# Patient Record
Sex: Female | Born: 1965 | Race: White | Hispanic: No | Smoking: Never smoker
Health system: Southern US, Community
[De-identification: ages and names within clinical notes are randomized; demographics above are authoritative.]

## PROBLEM LIST (undated history)

## (undated) DIAGNOSIS — K219 Gastro-esophageal reflux disease without esophagitis: Secondary | ICD-10-CM

## (undated) DIAGNOSIS — C539 Malignant neoplasm of cervix uteri, unspecified: Secondary | ICD-10-CM

## (undated) DIAGNOSIS — I10 Essential (primary) hypertension: Secondary | ICD-10-CM

## (undated) DIAGNOSIS — M1990 Unspecified osteoarthritis, unspecified site: Secondary | ICD-10-CM

## (undated) HISTORY — DX: Malignant neoplasm of cervix uteri, unspecified: C53.9

## (undated) HISTORY — DX: Gastro-esophageal reflux disease without esophagitis: K21.9

## (undated) HISTORY — DX: Essential (primary) hypertension: I10

## (undated) HISTORY — DX: Unspecified osteoarthritis, unspecified site: M19.90

---

## 2001-10-03 ENCOUNTER — Encounter: Payer: Self-pay | Admitting: Emergency Medicine

## 2001-10-03 ENCOUNTER — Emergency Department (HOSPITAL_COMMUNITY): Admission: EM | Admit: 2001-10-03 | Discharge: 2001-10-03 | Payer: Self-pay | Admitting: Emergency Medicine

## 2017-06-14 DIAGNOSIS — Z139 Encounter for screening, unspecified: Secondary | ICD-10-CM

## 2017-06-14 LAB — POCT URINALYSIS DIPSTICK
Bilirubin, UA: NEGATIVE
Glucose, UA: NEGATIVE
Ketones, UA: NEGATIVE
Leukocytes, UA: NEGATIVE
Nitrite, UA: NEGATIVE
Protein, UA: NEGATIVE
Spec Grav, UA: >=1.03 — AB (ref 1.010–1.025)
Urobilinogen, UA: 0.2 E.U./dL
pH, UA: 5.5 (ref 5.0–8.0)

## 2017-06-14 LAB — GLUCOSE, POCT (MANUAL RESULT ENTRY): POC Glucose: 75 mg/dl (ref 70–99)

## 2017-06-15 ENCOUNTER — Encounter: Payer: Self-pay | Admitting: *Deleted

## 2017-06-15 ENCOUNTER — Encounter: Payer: Self-pay | Admitting: Physician Assistant

## 2017-06-15 ENCOUNTER — Telehealth: Payer: Self-pay

## 2017-06-15 ENCOUNTER — Other Ambulatory Visit (HOSPITAL_COMMUNITY)
Admission: RE | Admit: 2017-06-15 | Discharge: 2017-06-15 | Disposition: A | Discharge status: Home / Self Care | Payer: Self-pay | Source: Ambulatory Visit | Attending: Physician Assistant | Admitting: Physician Assistant

## 2017-06-15 ENCOUNTER — Ambulatory Visit: Payer: Self-pay | Admitting: Physician Assistant

## 2017-06-15 VITALS — BP 140/80 | HR 83 | Temp 97.9°F | Ht 64.25 in | Wt 218.0 lb

## 2017-06-15 DIAGNOSIS — Z131 Encounter for screening for diabetes mellitus: Secondary | ICD-10-CM

## 2017-06-15 DIAGNOSIS — R030 Elevated blood-pressure reading, without diagnosis of hypertension: Secondary | ICD-10-CM

## 2017-06-15 DIAGNOSIS — Z13220 Encounter for screening for lipoid disorders: Secondary | ICD-10-CM

## 2017-06-15 DIAGNOSIS — N852 Hypertrophy of uterus: Secondary | ICD-10-CM

## 2017-06-15 DIAGNOSIS — L989 Disorder of the skin and subcutaneous tissue, unspecified: Secondary | ICD-10-CM

## 2017-06-15 DIAGNOSIS — Z1239 Encounter for other screening for malignant neoplasm of breast: Secondary | ICD-10-CM

## 2017-06-15 DIAGNOSIS — R1084 Generalized abdominal pain: Secondary | ICD-10-CM

## 2017-06-15 DIAGNOSIS — R319 Hematuria, unspecified: Secondary | ICD-10-CM

## 2017-06-15 LAB — LIPID PANEL
CHOL/HDL RATIO: 4.1 ratio
CHOLESTEROL: 219 mg/dL — AB (ref 0–200)
HDL: 53 mg/dL (ref >40)
LDL Cholesterol: 135 mg/dL — ABNORMAL HIGH (ref 0–99)
Triglycerides: 153 mg/dL — ABNORMAL HIGH (ref <150)
VLDL: 31 mg/dL (ref 0–40)

## 2017-06-15 LAB — HEMOGLOBIN A1C
HEMOGLOBIN A1C: 5.6 % (ref 4.8–5.6)
Mean Plasma Glucose: 114.02 mg/dL

## 2017-06-15 LAB — CBC WITH DIFFERENTIAL/PLATELET
BASOS PCT: 1 %
Basophils Absolute: 0 10*3/uL (ref 0.0–0.1)
EOS PCT: 4 %
Eosinophils Absolute: 0.2 10*3/uL (ref 0.0–0.7)
HEMATOCRIT: 43.9 % (ref 36.0–46.0)
Hemoglobin: 14 g/dL (ref 12.0–15.0)
Lymphocytes Relative: 36 %
Lymphs Abs: 2.3 10*3/uL (ref 0.7–4.0)
MCH: 28.1 pg (ref 26.0–34.0)
MCHC: 31.9 g/dL (ref 30.0–36.0)
MCV: 88.2 fL (ref 78.0–100.0)
MONO ABS: 0.6 10*3/uL (ref 0.1–1.0)
MONOS PCT: 9 %
NEUTROS ABS: 3.2 10*3/uL (ref 1.7–7.7)
Neutrophils Relative %: 50 %
PLATELETS: 258 10*3/uL (ref 150–400)
RBC: 4.98 MIL/uL (ref 3.87–5.11)
RDW: 13 % (ref 11.5–15.5)
WBC: 6.3 10*3/uL (ref 4.0–10.5)

## 2017-06-15 LAB — COMPREHENSIVE METABOLIC PANEL
ALBUMIN: 4 g/dL (ref 3.5–5.0)
ALT: 31 U/L (ref 14–54)
AST: 23 U/L (ref 15–41)
Alkaline Phosphatase: 75 U/L (ref 38–126)
Anion gap: 10 (ref 5–15)
BILIRUBIN TOTAL: 0.6 mg/dL (ref 0.3–1.2)
BUN: 18 mg/dL (ref 6–20)
CO2: 25 mmol/L (ref 22–32)
Calcium: 9.1 mg/dL (ref 8.9–10.3)
Chloride: 102 mmol/L (ref 101–111)
Creatinine, Ser: 0.86 mg/dL (ref 0.44–1.00)
GFR calc Af Amer: >60 mL/min (ref >60)
GLUCOSE: 102 mg/dL — AB (ref 65–99)
POTASSIUM: 4.1 mmol/L (ref 3.5–5.1)
Sodium: 137 mmol/L (ref 135–145)
TOTAL PROTEIN: 7.4 g/dL (ref 6.5–8.1)

## 2017-06-15 NOTE — Congregational Nurse Program (Signed)
Congregational Nurse Program Note  Date of Encounter: 06/14/2017  Past Medical History: No past medical history on file.  Encounter Details: CNP Questionnaire - 06/14/17 1500      Questionnaire   Patient Status  Not Applicable    Race  White or Caucasian    Location Patient Served At  [facility]  Not Applicable    Uninsured  Uninsured (NEW 1x/quarter)    Food  No food insecurities    Housing/Utilities  No permanent housing    Transportation  No transportation needs    Interpersonal Safety  Yes, feel physically and emotionally safe where you currently live    Medication  No medication insecurities    Referrals  [facility];Other;Primary Care Provider/Clinic;Behavioral/Mental Health Provider    ED Visit Averted  Not Applicable    Life-Saving Intervention Made  Not Applicable       New client to [facility]. Client is here today seeking assistance for a referral into a primary care provider. Her chief concern medically is that she concerned about having either a prolapsed uterus or bladder. She states she has felt perineum pressure about 3 months, but for the last month she has been able to visualize a "ball like" in her vaginal area. Client currently is unemployed she lost her job Last March and subsequently lost her car and then in June lost her home. She has no health insurance and has not seen a primary care provider in over 5 years per client. 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 Client states she receives food stamps and that she has an area where she can cook or heat up food. Client just recently started a disability application.  Past Medical History: Arthritis  Cervical cancer age 52    No Known drug allergies  Alert and oriented to person and place. Client is very emotional and cried through the entire screening. Client reports being  very scared and concerned about what is wrong with her and also has been under a tremendous amount of stress and loss. Denies suicidal or homicidal thoughts currently. She does admit to fleeting thoughts of "maybe I'd be better off not here" no plan to act on those thoughts. No thoughts of that today. Will refer to [facility] today for further evaluation and risk assessment. Client is agreeable that counseling would be helpful and is willing to do that. Client denies any chest pains or shortness of breath. She does report problems with acid reflux and has been prescribed something in the past for it, but doesn't remember, thinks it may have been Protonix.  Client does report random periods of nausea, but no vomiting. Denies any fevers. Afebrile today. She does report headaches frequently which she takes ibuprofen for. She reports lower abdominal pain in pelvic area, but is non tender with palpation. Bowel sounds present, abdomen soft. She denies constipation or trouble with bowels. She does report having to frequently go urinate about hourly. She denies burning or any visual blood in her urine. She does report that at times its darker but then is normal again. Will check a POCT urine dipstick today.  Client reports pressure in her rectal area and lower back pain. She however also reports that she has had some lower back and leg pain before the "prolapse". When she was working she does report lifting heavy amounts  over a 12 hour shift of up to 45 pounds. She has one child who is 10 and also recently moved away to [place] and is having client's first grandchild soon. Client continues to be very emotional during entire screening.  Discussed with client options for medical care without insurance or income. Those being, [facility] in [place] and discussed that process. The health department in [place] and [facility] with main office in [place], but does have half days once a  month in [place] and [place]. Client wishes to proceed with a referral into [facility] as she is very concerned and wants to be seen as soon as possible. Discussed with client that addressing this issue will be a process and she understands.  Referral made to the free clinic and appointment secured for 06/15/17 at 0945. Client given contact information as well as appointment date and time with discussion about calling 24 hrs ahead if she needs to reschedule or cancel and she states understanding.  Referral made to [facility] intern for further needs assessment and risk assessment.Mental health Safety plan formulated and copy given to client, client also given 24 Crisis number. Client connected to [facility] by intern. She is also scheduled to come back to see [facility] intern on 06/21/17 to discuss and work on housing application and follow up.   Client denies need for extra food help at present she receives food stamps and states that is adequate.   Will follow after initial appointment with [facility].  POCT glucose today 75 Random  POCT urine dipstick  Color amber Clarity clear Glucose: Negative BIL: Negative KET: Negative SG >=1.030 BLO: trace lysed PH: 5.5 PRO: Negative URO: 0.2 E.U/dl NIT: Negative LEU: Negative

## 2017-06-15 NOTE — Progress Notes (Signed)
BP 140/80 (BP Location: Left Arm, Patient Position: Sitting, Cuff Size: Normal)   Pulse 83   Temp 97.9 F (36.6 C)   Ht 5' 4.25" (1.632 m)   Wt 218 lb (98.9 kg)   SpO2 97%   BMI 37.13 kg/m    Subjective:    Patient ID: Elizabeth Moore, female    DOB: [birth year], 52 y.o.   MRN: [id number]  HPI: Elizabeth Moore is a 52 y.o. female presenting on 06/15/2017 for New Patient (Initial Visit) (pt has not been to a doctor. pt doesn't remmeber how long ago) and Knee Pain (bilateral intermittent pain pt states it is arthritis)   HPI   Chief Complaint  Patient presents with  . New Patient (Initial Visit)    pt has not been to a doctor. pt doesn't remmeber how long ago  . Knee Pain    bilateral intermittent pain pt states it is arthritis     Her chief concern is that she concerned about having either a prolapsed uterus or bladder. She states she has felt perineum pressure about 3 months, but for the last month she has been able to visualize a "ball like" in her vaginal area.  Last PAP about 19 years ago.    She has irritationof the genital area for about a month.  She used some neosporin but it didn't help.   Pt denies dysuria.  She says it itches after she urinates sometimes and her skin feels bad after she urinates.   UA yesterday trace blood otherwise negative.   She has not been sexually active for years.   She states her menses stopped at around age 62 or 35.  Pt is trying to get disability due to her knees.   Pt has never had a mammogram.   Relevant past medical, surgical, family and social history reviewed and updated as indicated. Interim medical history since our last visit reviewed. Allergies and medications reviewed and updated.   Current Outpatient Medications:  .  cetirizine (ZYRTEC) 10 MG tablet, Take 10 mg by mouth daily., Disp: , Rfl:  .  ibuprofen (ADVIL,MOTRIN) 200 MG tablet, Take 600 mg by mouth every 8 (eight) hours as needed., Disp: , Rfl:    Review  of Systems  Constitutional: Negative for appetite change, chills, diaphoresis, fatigue, fever and unexpected weight change.  HENT: Positive for ear pain. Negative for congestion, dental problem, drooling, facial swelling, hearing loss, mouth sores, sneezing, sore throat, trouble swallowing and voice change.   Eyes: Positive for itching. Negative for pain, discharge, redness and visual disturbance.  Respiratory: Negative for cough, choking, shortness of breath and wheezing.   Cardiovascular: Positive for leg swelling. Negative for chest pain and palpitations.  Gastrointestinal: Positive for abdominal pain. Negative for blood in stool, constipation, diarrhea and vomiting.  Endocrine: Positive for polydipsia. Negative for cold intolerance and heat intolerance.  Genitourinary: Positive for dysuria and hematuria. Negative for decreased urine volume.  Musculoskeletal: Positive for arthralgias, back pain and gait problem.  Skin: Negative for rash.  Allergic/Immunologic: Positive for environmental allergies.  Neurological: Negative for seizures, syncope, light-headedness and headaches.  Hematological: Negative for adenopathy.  Psychiatric/Behavioral: Negative for agitation, dysphoric mood and suicidal ideas. The patient is nervous/anxious.     Per HPI unless specifically indicated above     Objective:    BP 140/80 (BP Location: Left Arm, Patient Position: Sitting, Cuff Size: Normal)   Pulse 83   Temp 97.9 F (36.6 C)   Ht 5'  4.25" (1.632 m)   Wt 218 lb (98.9 kg)   SpO2 97%   BMI 37.13 kg/m   Wt Readings from Last 3 Encounters:  06/15/17 218 lb (98.9 kg)  06/14/17 219 lb 6.4 oz (99.5 kg)    Physical Exam  Constitutional: She is oriented to person, place, and time. She appears well-developed and well-nourished.  HENT:  Head: Normocephalic and atraumatic.  Neck: Neck supple.  Cardiovascular: Normal rate and regular rhythm.  Pulmonary/Chest: Effort normal and breath sounds normal.   Abdominal: Soft. Bowel sounds are normal. She exhibits no mass. There is no hepatosplenomegaly. There is no tenderness.  Genitourinary: There is lesion on the right labia. There is lesion on the left labia. Uterus is enlarged and tender.  Genitourinary Comments: Unable to visualize or palpate cervix.  There is no prolapse seen.  The vulva area with abnormal appearance- tissues thickened and white. (nurse Berenice assisted)  Musculoskeletal: She exhibits no edema.  Lymphadenopathy:    She has no cervical adenopathy.  Neurological: She is alert and oriented to person, place, and time.  Skin: Skin is warm and dry.  Psychiatric: She has a normal mood and affect. Her behavior is normal.  Vitals reviewed.   Results for orders placed or performed in visit on 06/14/17  POCT glucose  Result Value Ref Range   POC Glucose 75 70 - 99 mg/dl  POCT Urinalysis dipstick  Result Value Ref Range   Color, UA Amber    Clarity, UA Clear    Glucose, UA Negative    Bilirubin, UA Negative    Ketones, UA Negative    Spec Grav, UA >=1.030 (A) 1.010 - 1.025   Blood, UA Trace lysed    pH, UA 5.5 5.0 - 8.0   Protein, UA Negative    Urobilinogen, UA 0.2 0.2 or 1.0 E.U./dL   Nitrite, UA Negative    Leukocytes, UA Negative Negative   Appearance clear    Odor none       Assessment & Plan:   Encounter Diagnoses  Name Primary?  . Generalized abdominal pain Yes  . Abnormal skin of vulva   . Enlarged uterus   . Hematuria, unspecified type   . Elevated blood pressure reading   . Screening cholesterol level   . Screening for diabetes mellitus   . Screening for breast cancer      -will order CT abd and pelvis to evaluate the abd pain and pelvic symptoms -Will refer to gyn -will Get baseline labs -Pt was [facility] -screening mammogram ordered -no medication for BP today.  Will recheck at next office visit as today is likely elevated due to pt worried about her health -Pt to  follow up 3 weeks.  RTO sooner prn for worsening or new symptoms

## 2017-06-15 NOTE — Telephone Encounter (Signed)
Called to follow up with client after her initial appointment with the free clinic to establish care. Client states that all went well, she has had blood work drawn and is scheduled to have a CT Scan next week and they are working on getting her an appointment with OB/GYN. Client is to follow up with Sander Nephew BSW intern next Tuesday at [facility] to discuss housing application and other needs. Will follow up with her at that time too. Client agreeable.

## 2017-06-21 ENCOUNTER — Other Ambulatory Visit: Payer: Self-pay | Admitting: Student

## 2017-06-21 DIAGNOSIS — R1084 Generalized abdominal pain: Secondary | ICD-10-CM

## 2017-06-22 ENCOUNTER — Other Ambulatory Visit: Payer: Self-pay | Admitting: Physician Assistant

## 2017-06-22 ENCOUNTER — Ambulatory Visit (HOSPITAL_COMMUNITY)
Admission: RE | Admit: 2017-06-22 | Discharge: 2017-06-22 | Disposition: A | Discharge status: Home / Self Care | Payer: Self-pay | Source: Ambulatory Visit | Attending: Physician Assistant | Admitting: Physician Assistant

## 2017-06-22 DIAGNOSIS — R1084 Generalized abdominal pain: Secondary | ICD-10-CM | POA: Insufficient documentation

## 2017-06-22 DIAGNOSIS — N83202 Unspecified ovarian cyst, left side: Secondary | ICD-10-CM | POA: Insufficient documentation

## 2017-06-22 MED ORDER — IOPAMIDOL (ISOVUE-300) INJECTION 61%
100.0000 mL | Freq: Once | INTRAVENOUS | Status: AC | PRN
  Administered 2017-06-22: 100 mL via INTRAVENOUS

## 2017-06-22 MED ORDER — DICLOFENAC SODIUM 75 MG PO TBEC: 75.0000 mg | DELAYED_RELEASE_TABLET | Freq: Two times a day (BID) | ORAL | 0 refills | Status: DC | PRN

## 2017-06-23 ENCOUNTER — Ambulatory Visit (HOSPITAL_COMMUNITY): Payer: Self-pay

## 2017-06-27 ENCOUNTER — Other Ambulatory Visit: Payer: Self-pay

## 2017-06-27 ENCOUNTER — Ambulatory Visit (INDEPENDENT_AMBULATORY_CARE_PROVIDER_SITE_OTHER): Payer: Self-pay | Admitting: Obstetrics and Gynecology

## 2017-06-27 ENCOUNTER — Encounter: Payer: Self-pay | Admitting: Obstetrics and Gynecology

## 2017-06-27 VITALS — BP 130/86 | HR 94 | Ht 65.0 in | Wt 216.0 lb

## 2017-06-27 DIAGNOSIS — N904 Leukoplakia of vulva: Secondary | ICD-10-CM

## 2017-06-27 DIAGNOSIS — N9069 Other specified hypertrophy of vulva: Secondary | ICD-10-CM

## 2017-06-27 NOTE — Progress Notes (Signed)
Patient ID: [name], female   DOB: 1965/07/14, 52 y.o.   MRN: [id number]   [facility] Visit  @DATE @            Patient name: Elizabeth Moore MRN [id number]  Date of birth: 1965-05-23  CC & HPI:  Elizabeth Moore is a 52 y.o. female presenting today for possible bladder prolapse. The patient reports that she can feel and see something between her legs when she goes to the bathroom. She emptied her bladder prior to her visit. Associated symptoms include pelvic irritation, occasional stress incontinence, weight gain, and occasional constipation. She last had her menstrual cycle when she was 52 years old. She reports that she had a CT scan that showed a benign cyst on 06/22/2017. The patient denies fever, chills or any other symptoms or complaints at this time.   ROS:  ROS +pelvic irritation +occasional stress incontinence +weight gain +occasional constipation -fever -chills All systems are negative except as noted in the HPI and PMH.   Pertinent History Reviewed:   Reviewed: Significant for cervical cancer Medical         Past Medical History:  Diagnosis Date  . Arthritis   . Cervical cancer [facility]) age 7  . GERD (gastroesophageal reflux disease)                               Surgical Hx:   History reviewed. No pertinent surgical history. Medications: Reviewed & Updated - see associated section                       Current Outpatient Medications:  .  cetirizine (ZYRTEC) 10 MG tablet, Take 10 mg by mouth daily., Disp: , Rfl:  .  diclofenac (VOLTAREN) 75 MG EC tablet, Take 1 tablet (75 mg total) by mouth 2 (two) times daily as needed., Disp: 30 tablet, Rfl: 0 .  ibuprofen (ADVIL,MOTRIN) 200 MG tablet, Take 600 mg by mouth every 8 (eight) hours as needed., Disp: , Rfl:    Social History: Reviewed -  reports that  has never smoked. she has never used smokeless tobacco.  Objective Findings:  Vitals: Blood pressure 130/86, pulse 94, height 5\' 5"  (1.651 m),  weight 216 lb (98 kg).  PHYSICAL EXAMINATION General appearance - alert, well appearing, and in no distress, oriented to person, place, and time and overweight Mental status - alert, oriented to person, place, and time, normal mood, behavior, speech, dress, motor activity, and thought processes, affect appropriate to mood  PELVIC Vulva - vulvar dystrophy  Cervix - normal  No cystocele and no rectocele  Assessment & Plan:   A:  1. extensiveVulvar dystrophy 2. NO CYstocele or rectocele 3.   P:  1. Rx Triamcinolone 1% 2. Weight Loss 3.  4 wks, or PRN after return from [place]. 4. Importance of biopsy of any site not responding to topical steroids discussed.   By signing my name below, I, [name], attest that this documentation has been prepared under the direction and in the presence of Jonnie Kind, MD. Electronically Signed: [name], Medical Scribe. 06/27/17. 12:28 PM.  I personally performed the services described in this documentation, which was SCRIBED in my presence. The recorded information has been reviewed and considered accurate. It has been edited as necessary during review. Jonnie Kind, MD

## 2017-07-05 ENCOUNTER — Other Ambulatory Visit: Payer: Self-pay | Admitting: Obstetrics and Gynecology

## 2017-07-05 DIAGNOSIS — Z1231 Encounter for screening mammogram for malignant neoplasm of breast: Secondary | ICD-10-CM

## 2017-07-06 ENCOUNTER — Ambulatory Visit: Payer: Self-pay | Admitting: Physician Assistant

## 2017-07-06 ENCOUNTER — Encounter: Payer: Self-pay | Admitting: Physician Assistant

## 2017-07-06 VITALS — BP 138/86 | HR 73 | Temp 98.1°F | Ht 65.0 in | Wt 220.0 lb

## 2017-07-06 DIAGNOSIS — M179 Osteoarthritis of knee, unspecified: Secondary | ICD-10-CM | POA: Insufficient documentation

## 2017-07-06 DIAGNOSIS — M25562 Pain in left knee: Secondary | ICD-10-CM

## 2017-07-06 DIAGNOSIS — R030 Elevated blood-pressure reading, without diagnosis of hypertension: Secondary | ICD-10-CM

## 2017-07-06 DIAGNOSIS — E785 Hyperlipidemia, unspecified: Secondary | ICD-10-CM

## 2017-07-06 DIAGNOSIS — R1031 Right lower quadrant pain: Secondary | ICD-10-CM

## 2017-07-06 DIAGNOSIS — G8929 Other chronic pain: Secondary | ICD-10-CM

## 2017-07-06 DIAGNOSIS — E669 Obesity, unspecified: Secondary | ICD-10-CM

## 2017-07-06 DIAGNOSIS — M25561 Pain in right knee: Secondary | ICD-10-CM

## 2017-07-06 DIAGNOSIS — N83202 Unspecified ovarian cyst, left side: Secondary | ICD-10-CM | POA: Insufficient documentation

## 2017-07-06 DIAGNOSIS — M1710 Unilateral primary osteoarthritis, unspecified knee: Secondary | ICD-10-CM | POA: Insufficient documentation

## 2017-07-06 NOTE — Progress Notes (Signed)
BP 138/86 (BP Location: Left Arm, Patient Position: Sitting, Cuff Size: Normal)   Pulse 73   Temp 98.1 F (36.7 C)   Ht 5\' 5"  (1.651 m)   Wt 220 lb (99.8 kg)   SpO2 97%   BMI 36.61 kg/m    Subjective:    Patient ID: Elizabeth Moore, female    DOB: Apr 27, 1966, 52 y.o.   MRN: [id number]  HPI: Elizabeth Moore is a 52 y.o. female presenting on 07/06/2017 for Follow-up   HPI   Pt with B knee pain.  she Says she used to get shots in them in [place].  She says she Was told she needed surgery but she couldn't afford it.  Pt is trying to get disability due to her knee pain- perhaps surgery or other treatment might be a better option  Pt complains of  RLQ abdominal pain that started suddenly yesterday when she bent over to pick something off the floor.  She says the pain is worse with movement.  She denies fever, nausea, vomiting.    Pt has mammogram appt 07/22/17  Pt is still complaining of prolapse uterus.  She has taken pictures with her phone of her genital area.  Pt was seen by gyn on 2/11 and was found not to have prolapse.  She has follow up scheduled for March 11.   Relevant past medical, surgical, family and social history reviewed and updated as indicated. Interim medical history since our last visit reviewed. Allergies and medications reviewed and updated.   Current Outpatient Medications:  .  cetirizine (ZYRTEC) 10 MG tablet, Take 10 mg by mouth daily., Disp: , Rfl:  .  diclofenac (VOLTAREN) 75 MG EC tablet, Take 1 tablet (75 mg total) by mouth 2 (two) times daily as needed., Disp: 30 tablet, Rfl: 0 .  ibuprofen (ADVIL,MOTRIN) 200 MG tablet, Take 600 mg by mouth every 8 (eight) hours as needed., Disp: , Rfl:    Review of Systems  Constitutional: Negative for appetite change, chills, diaphoresis, fatigue, fever and unexpected weight change.  HENT: Negative for congestion, drooling, ear pain, facial swelling, hearing loss, mouth sores, sneezing, sore throat, trouble  swallowing and voice change.   Eyes: Negative for pain, discharge, redness, itching and visual disturbance.  Respiratory: Negative for cough, choking, shortness of breath and wheezing.   Cardiovascular: Negative for chest pain, palpitations and leg swelling.  Gastrointestinal: Positive for abdominal pain. Negative for blood in stool, constipation, diarrhea and vomiting.  Endocrine: Negative for cold intolerance, heat intolerance and polydipsia.  Genitourinary: Negative for decreased urine volume, dysuria and hematuria.  Musculoskeletal: Positive for arthralgias and back pain. Negative for gait problem.  Skin: Negative for rash.  Allergic/Immunologic: Negative for environmental allergies.  Neurological: Negative for seizures, syncope, light-headedness and headaches.  Hematological: Negative for adenopathy.  Psychiatric/Behavioral: Negative for agitation, dysphoric mood and suicidal ideas. The patient is not nervous/anxious.     Per HPI unless specifically indicated above     Objective:    BP 138/86 (BP Location: Left Arm, Patient Position: Sitting, Cuff Size: Normal)   Pulse 73   Temp 98.1 F (36.7 C)   Ht 5\' 5"  (1.651 m)   Wt 220 lb (99.8 kg)   SpO2 97%   BMI 36.61 kg/m   Wt Readings from Last 3 Encounters:  07/06/17 220 lb (99.8 kg)  06/27/17 216 lb (98 kg)  06/15/17 218 lb (98.9 kg)    Physical Exam  Constitutional: She is oriented to person, place,  and time. She appears well-developed and well-nourished.  HENT:  Head: Normocephalic and atraumatic.  Neck: Neck supple.  Cardiovascular: Normal rate and regular rhythm.  Pulmonary/Chest: Effort normal and breath sounds normal.  Abdominal: Soft. Bowel sounds are normal. She exhibits no distension, no ascites, no pulsatile midline mass and no mass. There is no hepatosplenomegaly. There is tenderness in the right lower quadrant. There is no rigidity, no rebound, no guarding and no CVA tenderness.  Musculoskeletal: She exhibits no  edema.       Right knee: She exhibits normal range of motion, no swelling, no effusion, normal alignment, no LCL laxity and no MCL laxity.       Left knee: She exhibits normal range of motion, no swelling, no effusion, normal alignment, no LCL laxity and no MCL laxity.  Crepitus B knees  Lymphadenopathy:    She has no cervical adenopathy.  Neurological: She is alert and oriented to person, place, and time.  Skin: Skin is warm and dry.  Psychiatric: She has a normal mood and affect. Her behavior is normal.  Vitals reviewed.   Results for orders placed or performed during the hospital encounter of 06/15/17  Hemoglobin A1c  Result Value Ref Range   Hgb A1c MFr Bld 5.6 4.8 - 5.6 %   Mean Plasma Glucose 114.02 mg/dL  CBC w/Diff/Platelet  Result Value Ref Range   WBC 6.3 4.0 - 10.5 K/uL   RBC 4.98 3.87 - 5.11 MIL/uL   Hemoglobin 14.0 12.0 - 15.0 g/dL   HCT 43.9 36.0 - 46.0 %   MCV 88.2 78.0 - 100.0 fL   MCH 28.1 26.0 - 34.0 pg   MCHC 31.9 30.0 - 36.0 g/dL   RDW 13.0 11.5 - 15.5 %   Platelets 258 150 - 400 K/uL   Neutrophils Relative % 50 %   Neutro Abs 3.2 1.7 - 7.7 K/uL   Lymphocytes Relative 36 %   Lymphs Abs 2.3 0.7 - 4.0 K/uL   Monocytes Relative 9 %   Monocytes Absolute 0.6 0.1 - 1.0 K/uL   Eosinophils Relative 4 %   Eosinophils Absolute 0.2 0.0 - 0.7 K/uL   Basophils Relative 1 %   Basophils Absolute 0.0 0.0 - 0.1 K/uL  Comprehensive metabolic panel  Result Value Ref Range   Sodium 137 135 - 145 mmol/L   Potassium 4.1 3.5 - 5.1 mmol/L   Chloride 102 101 - 111 mmol/L   CO2 25 22 - 32 mmol/L   Glucose, Bld 102 (H) 65 - 99 mg/dL   BUN 18 6 - 20 mg/dL   Creatinine, Ser 0.86 0.44 - 1.00 mg/dL   Calcium 9.1 8.9 - 10.3 mg/dL   Total Protein 7.4 6.5 - 8.1 g/dL   Albumin 4.0 3.5 - 5.0 g/dL   AST 23 15 - 41 U/L   ALT 31 14 - 54 U/L   Alkaline Phosphatase 75 38 - 126 U/L   Total Bilirubin 0.6 0.3 - 1.2 mg/dL   GFR calc non Af Amer >60 >60 mL/min   GFR calc Af Amer >60 >60  mL/min   Anion gap 10 5 - 15  Lipid panel  Result Value Ref Range   Cholesterol 219 (H) 0 - 200 mg/dL   Triglycerides 153 (H) <150 mg/dL   HDL 53 >40 mg/dL   Total CHOL/HDL Ratio 4.1 RATIO   VLDL 31 0 - 40 mg/dL   LDL Cholesterol 135 (H) 0 - 99 mg/dL      Assessment &  Plan:    Encounter Diagnoses  Name Primary?  . RLQ abdominal pain Yes  . Chronic pain of both knees   . Elevated blood pressure reading   . Hyperlipidemia, unspecified hyperlipidemia type   . Left ovarian cyst   . Obesity, unspecified classification, unspecified obesity type, unspecified whether serious comorbidity present      -reviewed labs and CT with pt -counseled pt to avoid taking IBU and diclofenac simulatneously   HCM: -mammogram as scheduled.   vulvar dystrophy and genital complaints: -follow up with gyn as scheduled  Hyperlipidemia -discussed hyperlipidemia and pt will try to lower with lowfat diet and exercise.    Elevated blood pressure: - improved diet and exercise should also help her BP.  Will monitor and recheck at follow appointment  B knee pain: -Get xrays both knees. Will refer to orthopedist when she gets her charity care approved.  RLQ abd pain: -discussed with pt likely pulled muscle.  Will get [place] to evaluate the appendix.  Discussed with pt that since she just had abd/pelvis CT 2 weeks ago and probability that pain is due to muscle strain, will opt for the [place] to limit excessive radiation.  Pt agrees.  Discussed with pt that if her pain worsens, if she develops vomiting or fever, she should RTO or go to ER  L ovarian cyst: -will plan follow up US in 6-12 weeks per radiology recommendation.     -pt is leaving for [place] from 3/15-4/30 for the birth of a grandchild.  Will have her follow up before she leaves

## 2017-07-06 NOTE — Patient Instructions (Signed)
Fat and Cholesterol Restricted Diet High levels of fat and cholesterol in your blood may lead to various health problems, such as diseases of the heart, blood vessels, gallbladder, liver, and pancreas. Fats are concentrated sources of energy that come in various forms. Certain types of fat, including saturated fat, may be harmful in excess. Cholesterol is a substance needed by your body in small amounts. Your body makes all the cholesterol it needs. Excess cholesterol comes from the food you eat. When you have high levels of cholesterol and saturated fat in your blood, health problems can develop because the excess fat and cholesterol will gather along the walls of your blood vessels, causing them to narrow. Choosing the right foods will help you control your intake of fat and cholesterol. This will help keep the levels of these substances in your blood within normal limits and reduce your risk of disease. What is my plan? Your health care provider recommends that you:  Limit your fat intake to ______% or less of your total calories per day.  Limit the amount of cholesterol in your diet to less than _________mg per day.  Eat 20-30 grams of fiber each day.  What types of fat should I choose?  Choose healthy fats more often. Choose monounsaturated and polyunsaturated fats, such as olive and canola oil, flaxseeds, walnuts, almonds, and seeds.  Eat more omega-3 fats. Good choices include salmon, mackerel, sardines, tuna, flaxseed oil, and ground flaxseeds. Aim to eat fish at least two times a week.  Limit saturated fats. Saturated fats are primarily found in animal products, such as meats, butter, and cream. Plant sources of saturated fats include palm oil, palm kernel oil, and coconut oil.  Avoid foods with partially hydrogenated oils in them. These contain trans fats. Examples of foods that contain trans fats are stick margarine, some tub margarines, cookies, crackers, and other baked goods. What  general guidelines do I need to follow? These guidelines for healthy eating will help you control your intake of fat and cholesterol:  Check food labels carefully to identify foods with trans fats or high amounts of saturated fat.  Fill one half of your plate with vegetables and green salads.  Fill one fourth of your plate with whole grains. Look for the word "whole" as the first word in the ingredient list.  Fill one fourth of your plate with lean protein foods.  Limit fruit to two servings a day. Choose fruit instead of juice.  Eat more foods that contain fiber, such as apples, broccoli, carrots, beans, peas, and barley.  Eat more home-cooked food and less restaurant, buffet, and fast food.  Limit or avoid alcohol.  Limit foods high in starch and sugar.  Limit fried foods.  Cook foods using methods other than frying. Baking, boiling, grilling, and broiling are all great options.  Lose weight if you are overweight. Losing just 5-10% of your initial body weight can help your overall health and prevent diseases such as diabetes and heart disease.  What foods can I eat? Grains  Whole grains, such as whole wheat or whole grain breads, crackers, cereals, and pasta. Unsweetened oatmeal, bulgur, barley, quinoa, or brown rice. Corn or whole wheat flour tortillas. Vegetables  Fresh or frozen vegetables (raw, steamed, roasted, or grilled). Green salads. Fruits  All fresh, canned (in natural juice), or frozen fruits. Meats and other protein foods  Ground beef (85% or leaner), grass-fed beef, or beef trimmed of fat. Skinless chicken or turkey. Ground chicken or turkey.   Pork trimmed of fat. All fish and seafood. Eggs. Dried beans, peas, or lentils. Unsalted nuts or seeds. Unsalted canned or dry beans. Dairy  Low-fat dairy products, such as skim or 1% milk, 2% or reduced-fat cheeses, low-fat ricotta or cottage cheese, or plain low-fat yo Fats and oils  Tub margarines without trans  fats. Light or reduced-fat mayonnaise and salad dressings. Avocado. Olive, canola, sesame, or safflower oils. Natural peanut or almond butter (choose ones without added sugar and oil). The items listed above may not be a complete list of recommended foods or beverages. Contact your dietitian for more options. Foods to avoid Grains  White bread. White pasta. White rice. Cornbread. Bagels, pastries, and croissants. Crackers that contain trans fat. Vegetables  White potatoes. Corn. Creamed or fried vegetables. Vegetables in a cheese sauce. Fruits  Dried fruits. Canned fruit in light or heavy syrup. Fruit juice. Meats and other protein foods  Fatty cuts of meat. Ribs, chicken wings, bacon, sausage, bologna, salami, chitterlings, fatback, hot dogs, bratwurst, and packaged luncheon meats. Liver and organ meats. Dairy  Whole or 2% milk, cream, half-and-half, and cream cheese. Whole milk cheeses. Whole-fat or sweetened yogurt. Full-fat cheeses. Nondairy creamers and whipped toppings. Processed cheese, cheese spreads, or cheese curds. Beverages  Alcohol. Sweetened drinks (such as sodas, lemonade, and fruit drinks or punches). Fats and oils  Butter, stick margarine, lard, shortening, ghee, or bacon fat. Coconut, palm kernel, or palm oils. Sweets and desserts  Corn syrup, sugars, honey, and molasses. Candy. Jam and jelly. Syrup. Sweetened cereals. Cookies, pies, cakes, donuts, muffins, and ice cream. The items listed above may not be a complete list of foods and beverages to avoid. Contact your dietitian for more information. This information is not intended to replace advice given to you by your health care provider. Make sure you discuss any questions you have with your health care provider. Document Released: 05/03/2005 Document Revised: 05/24/2014 Document Reviewed: 08/01/2013 Elsevier Interactive Patient Education  2018 [employer].  

## 2017-07-08 ENCOUNTER — Ambulatory Visit (HOSPITAL_COMMUNITY)
Admission: RE | Admit: 2017-07-08 | Discharge: 2017-07-08 | Disposition: A | Discharge status: Home / Self Care | Payer: Self-pay | Source: Ambulatory Visit | Attending: Physician Assistant | Admitting: Physician Assistant

## 2017-07-08 DIAGNOSIS — M25562 Pain in left knee: Secondary | ICD-10-CM | POA: Insufficient documentation

## 2017-07-08 DIAGNOSIS — M25561 Pain in right knee: Secondary | ICD-10-CM | POA: Insufficient documentation

## 2017-07-08 DIAGNOSIS — G8929 Other chronic pain: Secondary | ICD-10-CM | POA: Insufficient documentation

## 2017-07-08 DIAGNOSIS — R1031 Right lower quadrant pain: Secondary | ICD-10-CM | POA: Insufficient documentation

## 2017-07-13 ENCOUNTER — Other Ambulatory Visit: Payer: Self-pay

## 2017-07-13 ENCOUNTER — Emergency Department (HOSPITAL_COMMUNITY): Payer: Self-pay

## 2017-07-13 ENCOUNTER — Encounter (HOSPITAL_COMMUNITY): Payer: Self-pay | Admitting: Emergency Medicine

## 2017-07-13 ENCOUNTER — Emergency Department (HOSPITAL_COMMUNITY)
Admission: EM | Admit: 2017-07-13 | Discharge: 2017-07-13 | Disposition: A | Discharge status: Home / Self Care | Payer: Self-pay | Attending: Emergency Medicine | Admitting: Emergency Medicine

## 2017-07-13 DIAGNOSIS — G8929 Other chronic pain: Secondary | ICD-10-CM | POA: Insufficient documentation

## 2017-07-13 DIAGNOSIS — Z8541 Personal history of malignant neoplasm of cervix uteri: Secondary | ICD-10-CM | POA: Insufficient documentation

## 2017-07-13 DIAGNOSIS — M25511 Pain in right shoulder: Secondary | ICD-10-CM | POA: Insufficient documentation

## 2017-07-13 DIAGNOSIS — M79621 Pain in right upper arm: Secondary | ICD-10-CM

## 2017-07-13 MED ORDER — HYDROCODONE-ACETAMINOPHEN 5-325 MG PO TABS: 1.0000 | ORAL_TABLET | ORAL | 0 refills | Status: DC | PRN

## 2017-07-13 NOTE — ED Provider Notes (Signed)
Medical screening examination/treatment/procedure(s) were conducted as a shared visit with non-physician practitioner(s) and myself.  I personally evaluated the patient during the encounter.   EKG Interpretation None       Patient seen by me along with physician assistant patient with complaint of right shoulder pain for several weeks.  Been worse lately.  Pain is increased by trying to raise the arm above the head in fact the pain gets so severe she cannot do that.  Also gets some increased pain while trying to open a doorknob.  She thought her hands were weak but clinically they appear not to be weak habits with difficulty opening a doorknob due to the pain.  Patient without any distal swelling to the hand radial pulses good has some prominent superficial veins in the right arm area.  But no definitive edema.  X-ray of the area is negative no evidence of any direct trauma.  But symptoms very suggestive of a rotator cuff injury.  Patient without any chest pain or shortness of breath will get ultrasound tomorrow to rule out any DVT clinically I think it is unlikely.  Certainly would be causing all this pain.  Patient be treated with sling referral to orthopedics and outpatient upper extremity ultrasound venous.  And something for pain.   Fredia Sorrow, MD 07/13/17 [phone]

## 2017-07-13 NOTE — Discharge Instructions (Signed)
As discussed, your pain may be due to a rotator cuff injury or chronic degenerative changes in your shoulder although your xray tonight is normal.  With the pain in your bicep and the distended blood vessels, you are being scheduled for an ultrasound to make sure you do not have a blood clot in your arm.  Call for an appointment time tomorrow as discussed.  You may take the hydrocodone prescribed for pain relief.  This will make you drowsy - do not drive within 4 hours of taking this medication. You may get comfort wearing the sling.  You may need to see an orthopedist if tomorrow test is normal for further evaluation of your suspected rotator cuff problems.

## 2017-07-13 NOTE — ED Triage Notes (Signed)
Pt states shoulder pain since yesterday but is chronic in nature. [facility] and right lower quad pain and was unable to get shoulder looked at. Pt states started hurting worse yesterday.

## 2017-07-13 NOTE — ED Provider Notes (Signed)
[facility] EMERGENCY DEPARTMENT Provider Note   CSN: [id number] Arrival date & time: 07/13/17  1616     History   Chief Complaint Chief Complaint  Patient presents with  . Shoulder Pain    HPI Elizabeth Moore is a 52 y.o. female presenting with right shoulder and upper arm pain.  She describes a several month history of right shoulder pain which is worsened with certain movements and positional changes, particularly attempting to raise her arm over shoulder height and certain twisting movements such as turning doorknobs which triggers this pain.  She denies any injury but endorses a prior work history of frequent repetitive overuse motions with her arms.  She has tenderness across her upper shoulder muscles and into her shoulder blade.  Her symptoms have become severe and constant since yesterday.  She does have new radiation of pain into her lower bicep and has noticed the veins on her upper arm are more prominent since her pain worsened.  She denies pain in her forearm or hand.  She does report intermittent swelling in her bilateral hands right greater than left.  She denies injury.  She is currently being treated for bilateral knee pain felt to be arthritis by her primary provider and she is awaiting orthopedic referral at this time.  She is found no alleviators for her symptoms.  She was prescribed diclofenac which she has not filled but is waiting at her pharmacy.  The history is provided by the patient.    Past Medical History:  Diagnosis Date  . Arthritis   . Cervical cancer [facility]) age 50  . GERD (gastroesophageal reflux disease)     Patient Active Problem List   Diagnosis Date Noted  . Hyperlipidemia 07/06/2017  . Chronic pain of both knees 07/06/2017  . Left ovarian cyst 07/06/2017    History reviewed. No pertinent surgical history.  OB History    Gravida Para Term Preterm AB Living   1 1       1    SAB TAB Ectopic Multiple Live Births                   Home  Medications    Prior to Admission medications   Medication Sig Start Date End Date Taking? Authorizing Provider  cetirizine (ZYRTEC) 10 MG tablet Take 10 mg by mouth daily.    [provider]  diclofenac (VOLTAREN) 75 MG EC tablet Take 1 tablet (75 mg total) by mouth 2 (two) times daily as needed. 06/22/17   Soyla Dryer, PA-C  HYDROcodone-acetaminophen (NORCO/VICODIN) 5-325 MG tablet Take 1 tablet by mouth every 4 (four) hours as needed. 07/13/17   Evalee Jefferson, PA-C  ibuprofen (ADVIL,MOTRIN) 200 MG tablet Take 600 mg by mouth every 8 (eight) hours as needed.    [provider]    Family History Family History  Problem Relation Age of Onset  . Heart disease Mother   . Cancer Father        lung  . Heart disease Maternal Aunt   . Cancer Paternal Uncle        Lung  . Cancer Paternal Uncle        prostate cancer    Social History Social History   Tobacco Use  . Smoking status: Never Smoker  . Smokeless tobacco: Never Used  Substance Use Topics  . Alcohol use: Yes    Alcohol/week: 0.6 oz    Types: 1 Cans of beer per week    Comment: a  beer once a month  . Drug use: No     Allergies   Patient has no known allergies.   Review of Systems Review of Systems  Constitutional: Negative for fever.  HENT: Negative.   Respiratory: Negative for cough and shortness of breath.   Cardiovascular: Negative.  Negative for chest pain.  Musculoskeletal: Positive for arthralgias. Negative for joint swelling, myalgias, neck pain and neck stiffness.  Neurological: Negative for weakness and numbness.     Physical Exam Updated Vital Signs BP (!) 159/67 (BP Location: Left Arm)   Pulse 80   Temp 98.2 F (36.8 C) (Oral)   Resp 18   Ht 5\' 5"  (1.651 m)   Wt 99.8 kg (220 lb)   SpO2 99%   BMI 36.61 kg/m   Physical Exam  Constitutional: She appears well-developed and well-nourished.  HENT:  Head: Atraumatic.  Neck: Normal range of motion.  Cardiovascular:   Pulses:      Radial pulses are 2+ on the right side, and 2+ on the left side.  Pulses equal bilaterally  Musculoskeletal: She exhibits tenderness.       Right shoulder: She exhibits bony tenderness. She exhibits no swelling, no spasm and normal pulse.  Tender to palpation of right anterior humeral head without deformity.  There is no crepitus.  She is fairly tender with both active and passive range of motion with flexion and abduction.  She is also tender to palpation in her right axilla and along the course of her medial bicep without palpable deformity, induration.  She does have prominent slightly tortuous appearing veins in her right upper arm.  There is no forearm or hand edema at this time.  Neurological: She is alert. She has normal strength. She displays normal reflexes. No sensory deficit.  Skin: Skin is warm and dry.  Psychiatric: She has a normal mood and affect.     ED Treatments / Results  Labs (all labs ordered are listed, but only abnormal results are displayed) Labs Reviewed - No data to display  EKG  EKG Interpretation None       Radiology Dg Shoulder Right  Result Date: 07/13/2017 CLINICAL DATA:  Chronic right shoulder pain, worse since yesterday. No known injury. EXAM: RIGHT SHOULDER - 2+ VIEW COMPARISON:  None. FINDINGS: There is no evidence of fracture or dislocation. There is no evidence of arthropathy or other focal bone abnormality. Soft tissues are unremarkable. IMPRESSION: Normal examination. Electronically Signed   By: Claudie Revering M.D.   On: 07/13/2017 17:05    Procedures Procedures (including critical care time)  Medications Ordered in ED Medications - No data to display   Initial Impression / Assessment and Plan / ED Course  I have reviewed the triage vital signs and the nursing notes.  Pertinent labs & imaging results that were available during my care of the patient were reviewed by me and considered in my medical decision making (see chart  for details).     Patient with chronic right shoulder pain suggesting possible rotator cuff source.  However she has fairly new onset severe pain since yesterday.  With her history of reported intermittent edema and prominent veins in her right upper arm noted on today's exam we will order an ultrasound to rule out DVT.  She was given short course of hydrocodone and also placed in a sling for pain relief.  She will be advised further if her ultrasound is positive for DVT, otherwise plan for orthopedic follow-up which her primary  care is in the process of arranging.  Final Clinical Impressions(s) / ED Diagnoses   Final diagnoses:  Pain in right upper arm  Chronic right shoulder pain    ED Discharge Orders        Ordered    HYDROcodone-acetaminophen (NORCO/VICODIN) 5-325 MG tablet  Every 4 hours PRN     07/13/17 1908    US Venous Img Upper Uni Right     07/13/17 1910       Landis Martins 07/13/17 2128    Fredia Sorrow, MD 07/14/17 1810

## 2017-07-14 ENCOUNTER — Ambulatory Visit (HOSPITAL_COMMUNITY)
Admission: RE | Admit: 2017-07-14 | Discharge: 2017-07-14 | Disposition: A | Discharge status: Home / Self Care | Payer: Self-pay | Source: Ambulatory Visit | Attending: Emergency Medicine | Admitting: Emergency Medicine

## 2017-07-14 DIAGNOSIS — M79621 Pain in right upper arm: Secondary | ICD-10-CM | POA: Insufficient documentation

## 2017-07-14 DIAGNOSIS — M25511 Pain in right shoulder: Secondary | ICD-10-CM | POA: Insufficient documentation

## 2017-07-18 ENCOUNTER — Encounter: Payer: Self-pay | Admitting: Physician Assistant

## 2017-07-18 ENCOUNTER — Ambulatory Visit: Payer: Self-pay | Admitting: Physician Assistant

## 2017-07-18 VITALS — BP 144/94 | HR 78 | Temp 98.6°F | Wt 219.0 lb

## 2017-07-18 DIAGNOSIS — N83202 Unspecified ovarian cyst, left side: Secondary | ICD-10-CM

## 2017-07-18 DIAGNOSIS — G8929 Other chronic pain: Secondary | ICD-10-CM

## 2017-07-18 DIAGNOSIS — M25562 Pain in left knee: Secondary | ICD-10-CM

## 2017-07-18 DIAGNOSIS — M25561 Pain in right knee: Secondary | ICD-10-CM

## 2017-07-18 DIAGNOSIS — R030 Elevated blood-pressure reading, without diagnosis of hypertension: Secondary | ICD-10-CM

## 2017-07-18 DIAGNOSIS — M25511 Pain in right shoulder: Secondary | ICD-10-CM

## 2017-07-18 MED ORDER — HYDROCODONE-ACETAMINOPHEN 5-325 MG PO TABS: 1.0000 | ORAL_TABLET | Freq: Four times a day (QID) | ORAL | 0 refills | Status: DC | PRN

## 2017-07-18 MED ORDER — CYCLOBENZAPRINE HCL 10 MG PO TABS: 10.0000 mg | ORAL_TABLET | Freq: Three times a day (TID) | ORAL | 0 refills | Status: DC | PRN

## 2017-07-18 NOTE — Progress Notes (Signed)
BP (!) 144/94 (BP Location: Right Arm, Patient Position: Sitting, Cuff Size: Normal)   Pulse 78   Temp 98.6 F (37 C)   Wt 219 lb (99.3 kg)   SpO2 97%   BMI 36.44 kg/m    Subjective:    Patient ID: Elizabeth Moore, female    DOB: Sep 12, 1965, 52 y.o.   MRN: [id number]  HPI: RUTH KOVICH is a 52 y.o. female presenting on 07/18/2017 for Follow-up   HPI   Pt says she has been approved [facility]% [facility].   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 Also on Left xray shows chondrocalcinosis -RLQ abd pain- US done-  without lower quadrant abnormality.  Pt states pain better -L ovarian cyst- recomended follow up  US 6-12 wk per radiology. (08/03/17-09/14/17) -Also she was seen in ER for R shoulder and arm pain.  She had negative xray, negative doppler US and was given hydrocodone  Pt says her most pressing issue is the R shoulder.  She denies injury.  She says the shoulder and arm has been bothering her for several years. It has been much worse the past 1 1/2 weeks.  She says she was crying Wednesday night because it was hurting so badly.    Relevant past medical, surgical, family and social history reviewed and updated as indicated. Interim medical history since our last visit reviewed. Allergies and medications reviewed and updated.   Current Outpatient Medications:  .  cetirizine (ZYRTEC) 10 MG tablet, Take 10 mg by mouth daily., Disp: , Rfl:  .  diclofenac (VOLTAREN) 75 MG EC tablet, Take 1 tablet (75 mg total) by mouth 2 (two) times daily as needed., Disp: 30 tablet, Rfl: 0 .  ibuprofen (ADVIL,MOTRIN) 200 MG tablet, Take 600 mg by mouth every 8 (eight) hours as needed., Disp: , Rfl:    Review of Systems  Constitutional: Negative for appetite change, chills, diaphoresis, fatigue, fever and unexpected weight  change.  HENT: Positive for ear pain. Negative for congestion, dental problem, drooling, facial swelling, hearing loss, mouth sores, sneezing, sore throat, trouble swallowing and voice change.   Eyes: Negative for pain, discharge, redness, itching and visual disturbance.  Respiratory: Negative for cough, choking, shortness of breath and wheezing.   Cardiovascular: Negative for chest pain, palpitations and leg swelling.  Gastrointestinal: Negative for abdominal pain, blood in stool, constipation, diarrhea and vomiting.  Endocrine: Negative for cold intolerance, heat intolerance and polydipsia.  Genitourinary: Negative for decreased urine volume, dysuria and hematuria.  Musculoskeletal: Positive for arthralgias. Negative for back pain and gait problem.  Skin: Negative for rash.  Allergic/Immunologic: Negative for environmental allergies.  Neurological: Negative for seizures, syncope, light-headedness and headaches.  Hematological: Negative for adenopathy.  Psychiatric/Behavioral: Negative for agitation, dysphoric mood and suicidal ideas. The patient is not nervous/anxious.     Per HPI unless specifically indicated above     Objective:    BP (!) 144/94 (BP Location: Right Arm, Patient Position: Sitting, Cuff Size: Normal)   Pulse 78   Temp 98.6 F (37 C)   Wt 219 lb (99.3 kg)   SpO2 97%   BMI 36.44 kg/m   Wt Readings from Last 3 Encounters:  07/18/17 219 lb (99.3 kg)  07/13/17 220 lb (99.8 kg)  07/06/17 220 lb (99.8 kg)    Physical Exam  Constitutional: She is  oriented to person, place, and time. She appears well-developed and well-nourished.  HENT:  Head: Normocephalic and atraumatic.  Neck: Neck supple.  Cardiovascular: Normal rate and regular rhythm.  Pulmonary/Chest: Effort normal and breath sounds normal.  Abdominal: Soft. Bowel sounds are normal. She exhibits no mass. There is no hepatosplenomegaly. There is no tenderness.  Musculoskeletal: She exhibits no edema.        Right shoulder: She exhibits decreased range of motion and tenderness. She exhibits no bony tenderness, no swelling, no deformity and normal pulse.       Right elbow: Normal.      Right wrist: Normal.  R shoulder diffusely tender.  Significantly reduced ROM both active and passive.  Only abducts to about 5 degrees and extends to about 10 degrees.  Rotation also limited.    Lymphadenopathy:    She has no cervical adenopathy.  Neurological: She is alert and oriented to person, place, and time.  Skin: Skin is warm and dry.  Psychiatric: She has a normal mood and affect. Her behavior is normal.  Vitals reviewed.       Assessment & Plan:    Encounter Diagnoses  Name Primary?  . Right shoulder pain, unspecified chronicity Yes  . Chronic pain of both knees   . Elevated blood pressure reading   . Left ovarian cyst      -recommended pt cancel mammogram appointment schedule for this week since she cannot move her arm as required fo rthe test -MRI shoulder -refer to orthopedics for the shoulder.  Discussed with pt that we cannot guarantee that she will get appointment before her planned trip to [place] -pt is given rx for hydrocodone and flexeril.  She is counseled to avoid driving on this rx due to sedation. Discussed with pt that this will not be refilled as [facility] does not prescribe narcotics -pt to follow up with gyn as scheduled -will monitor the bp -pt is still planning to go to [place] on March 15. Will schedule her to follow up on March 14.  RTO sooner prn

## 2017-07-19 ENCOUNTER — Ambulatory Visit (HOSPITAL_COMMUNITY): Payer: Self-pay

## 2017-07-25 ENCOUNTER — Ambulatory Visit (INDEPENDENT_AMBULATORY_CARE_PROVIDER_SITE_OTHER): Payer: Self-pay | Admitting: Obstetrics and Gynecology

## 2017-07-25 ENCOUNTER — Encounter: Payer: Self-pay | Admitting: Obstetrics and Gynecology

## 2017-07-25 ENCOUNTER — Ambulatory Visit (HOSPITAL_COMMUNITY)
Admission: RE | Admit: 2017-07-25 | Discharge: 2017-07-25 | Disposition: A | Discharge status: Home / Self Care | Payer: Self-pay | Source: Ambulatory Visit | Attending: Physician Assistant | Admitting: Physician Assistant

## 2017-07-25 VITALS — BP 122/90 | HR 97 | Ht 65.0 in | Wt 219.0 lb

## 2017-07-25 DIAGNOSIS — N369 Urethral disorder, unspecified: Secondary | ICD-10-CM

## 2017-07-25 DIAGNOSIS — R351 Nocturia: Secondary | ICD-10-CM

## 2017-07-25 DIAGNOSIS — N904 Leukoplakia of vulva: Secondary | ICD-10-CM

## 2017-07-25 DIAGNOSIS — N3941 Urge incontinence: Secondary | ICD-10-CM | POA: Insufficient documentation

## 2017-07-25 DIAGNOSIS — M75111 Incomplete rotator cuff tear or rupture of right shoulder, not specified as traumatic: Secondary | ICD-10-CM | POA: Insufficient documentation

## 2017-07-25 MED ORDER — TOLTERODINE TARTRATE ER 2 MG PO CP24: 2.0000 mg | ORAL_CAPSULE | Freq: Every day | ORAL | 2 refills | Status: DC

## 2017-07-25 NOTE — Progress Notes (Signed)
Patient ID: Elizabeth Moore, female   DOB: 11-04-1965, 52 y.o.   MRN: [id number]   [place] [facility] Visit  @DATE @            Patient name: Elizabeth Moore MRN [id number]  Date of birth: [birth year]  CC & HPI:  Elizabeth Moore is a 52 y.o. 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 The patient voids about every 1.5 hours even after cutting back on liquid. This includes during the night as well. She reports that she is going out of town later this week to visit family in [place]. The patient denies fever, chills or any other symptoms or complaints at this time.   ROS:  ROS +vulvar dystrophy +increased urinary output, frequency, sometimes loss of urine before she can make bathroom +nocturia -fever -chills All systems are negative except as noted in the HPI and PMH.   Pertinent History Reviewed:   Reviewed: Significant for cervical cancer Medical         Past Medical History:  Diagnosis Date  . Arthritis   . Cervical cancer [facility]) age 82  . GERD (gastroesophageal reflux disease)                               Surgical Hx:   History reviewed. No pertinent surgical history. Medications: Reviewed & Updated - see associated section                       Current Outpatient Medications:  .  cetirizine (ZYRTEC) 10 MG tablet, Take 10 mg by mouth daily., Disp: , Rfl:  .  cyclobenzaprine (FLEXERIL) 10 MG tablet, Take 1 tablet (10 mg total) by mouth 3 (three) times daily as needed for muscle spasms., Disp: 30 tablet, Rfl: 0 .  diclofenac (VOLTAREN) 75 MG EC tablet, Take 1 tablet (75 mg total) by mouth 2 (two) times daily as needed., Disp: 30 tablet, Rfl: 0 .  HYDROcodone-acetaminophen (NORCO) 5-325 MG tablet, Take 1 tablet by mouth every 6 (six) hours as needed for moderate pain., Disp: 20 tablet, Rfl: 0 .  ibuprofen (ADVIL,MOTRIN) 200 MG tablet, Take 600 mg by mouth every 8 (eight) hours as needed., Disp: , Rfl:    Social  History: Reviewed -  reports that  has never smoked. she has never used smokeless tobacco.  Objective Findings:  Vitals: Blood pressure 122/90, pulse 97, height 5\' 5"  (1.651 m), weight 219 lb (99.3 kg).  PHYSICAL EXAMINATION General appearance - alert, well appearing, and in no distress, oriented to person, place, and time and overweight Mental status - alert, oriented to person, place, and time, normal mood, behavior, speech, dress, motor activity, and thought processes, affect appropriate to mood  PELVIC Vulva - vulvar dystrophy, present but improved Vagina - urethral prolapse, with cough her prolapse does not worsen, excellent support Cervix - well supported  Uterus - NSSC  Assessment & Plan:   A:  1. Vulvar dystrophy, improved 2. Urethral prolapse, mild  3. Urge incontinence 4    Body mass index is 36.44 kg/m.  P:  Continue 1% hydrocortisone as needed for irritation 1. Rx Detrol LA 2. F/U  3.  Weight loss encouraged  By signing my name below, I, [name], attest that this documentation has been prepared under the direction and in the presence of Jonnie Kind, MD. Electronically Signed: [name],  Medical Scribe. 07/25/17. 10:26 AM.  I personally performed the services described in this documentation, which was SCRIBED in my presence. The recorded information has been reviewed and considered accurate. It has been edited as necessary during review. Jonnie Kind, MD

## 2017-07-26 ENCOUNTER — Ambulatory Visit: Payer: Self-pay | Admitting: Physician Assistant

## 2017-07-28 ENCOUNTER — Encounter: Payer: Self-pay | Admitting: Physician Assistant

## 2017-07-28 ENCOUNTER — Ambulatory Visit: Payer: Self-pay | Admitting: Physician Assistant

## 2017-07-28 VITALS — BP 122/86 | HR 82 | Temp 98.1°F | Ht 65.0 in | Wt 219.0 lb

## 2017-07-28 DIAGNOSIS — M25511 Pain in right shoulder: Secondary | ICD-10-CM

## 2017-07-28 DIAGNOSIS — N3941 Urge incontinence: Secondary | ICD-10-CM

## 2017-07-28 MED ORDER — OXYBUTYNIN CHLORIDE 5 MG PO TABS: 5.0000 mg | ORAL_TABLET | Freq: Two times a day (BID) | ORAL | 1 refills | Status: DC

## 2017-07-28 NOTE — Progress Notes (Signed)
BP 122/86 (BP Location: Left Arm, Patient Position: Sitting, Cuff Size: Large)   Pulse 82   Temp 98.1 F (36.7 C) (Other (Comment))   Ht 5\' 5"  (1.651 m)   Wt 219 lb (99.3 kg)   SpO2 98%   BMI 36.44 kg/m    Subjective:    Patient ID: Elizabeth Moore, female    DOB: Sep 16, 1965, 52 y.o.   MRN: [id number]  HPI: Elizabeth Moore is a 52 y.o. female presenting on 07/28/2017 for Follow-up   HPI She is leaving for [facility] tomorrow  Pt had follow up with Gyn.  She was given rx for detrol but she can't afford it- $200.  With coupon it would still be $70.  She had MRI of shoulder and has appointment to see orthopedist when she returns from [place]  Relevant past medical, surgical, family and social history reviewed and updated as indicated. Interim medical history since our last visit reviewed. Allergies and medications reviewed and updated.   Current Outpatient Medications:  .  cetirizine (ZYRTEC) 10 MG tablet, Take 10 mg by mouth daily., Disp: , Rfl:  .  cyclobenzaprine (FLEXERIL) 10 MG tablet, Take 1 tablet (10 mg total) by mouth 3 (three) times daily as needed for muscle spasms., Disp: 30 tablet, Rfl: 0 .  diclofenac (VOLTAREN) 75 MG EC tablet, Take 1 tablet (75 mg total) by mouth 2 (two) times daily as needed., Disp: 30 tablet, Rfl: 0 .  HYDROcodone-acetaminophen (NORCO) 5-325 MG tablet, Take 1 tablet by mouth every 6 (six) hours as needed for moderate pain., Disp: 20 tablet, Rfl: 0 .  ibuprofen (ADVIL,MOTRIN) 200 MG tablet, Take 600 mg by mouth every 8 (eight) hours as needed., Disp: , Rfl:  .  tolterodine (DETROL LA) 2 MG 24 hr capsule, Take 1 capsule (2 mg total) by mouth daily. (Patient not taking: Reported on 07/28/2017), Disp: 30 capsule, Rfl: 2   Review of Systems  Constitutional: Negative for appetite change, chills, diaphoresis, fatigue, fever and unexpected weight change.  HENT: Negative for congestion, dental problem, drooling, ear pain, facial swelling, hearing loss,  mouth sores, sneezing, sore throat, trouble swallowing and voice change.   Eyes: Negative for pain, discharge, redness, itching and visual disturbance.  Respiratory: Negative for cough, choking, shortness of breath and wheezing.   Cardiovascular: Negative for chest pain, palpitations and leg swelling.  Gastrointestinal: Negative for abdominal pain, blood in stool, constipation, diarrhea and vomiting.  Endocrine: Negative for cold intolerance, heat intolerance and polydipsia.  Genitourinary: Negative for decreased urine volume, dysuria and hematuria.  Musculoskeletal: Positive for arthralgias and back pain. Negative for gait problem.  Skin: Negative for rash.  Allergic/Immunologic: Negative for environmental allergies.  Neurological: Negative for seizures, syncope, light-headedness and headaches.  Hematological: Negative for adenopathy.  Psychiatric/Behavioral: Negative for agitation, dysphoric mood and suicidal ideas. The patient is not nervous/anxious.     Per HPI unless specifically indicated above     Objective:    BP 122/86 (BP Location: Left Arm, Patient Position: Sitting, Cuff Size: Large)   Pulse 82   Temp 98.1 F (36.7 C) (Other (Comment))   Ht 5\' 5"  (1.651 m)   Wt 219 lb (99.3 kg)   SpO2 98%   BMI 36.44 kg/m   Wt Readings from Last 3 Encounters:  07/28/17 219 lb (99.3 kg)  07/25/17 219 lb (99.3 kg)  07/18/17 219 lb (99.3 kg)    Physical Exam  Constitutional: She is oriented to person, place, and time. She appears well-developed  and well-nourished.  HENT:  Head: Normocephalic and atraumatic.  Neck: Neck supple.  Cardiovascular: Normal rate and regular rhythm.  Pulmonary/Chest: Effort normal and breath sounds normal.  Abdominal: Soft. Bowel sounds are normal. She exhibits no mass. There is no hepatosplenomegaly. There is no tenderness.  Musculoskeletal: She exhibits no edema.       Right shoulder: She exhibits decreased range of motion and tenderness.  Good movement  at low angles, like pendulum swings.  Unable to fully abduct or extend  Lymphadenopathy:    She has no cervical adenopathy.  Neurological: She is alert and oriented to person, place, and time.  Skin: Skin is warm and dry.  Psychiatric: She has a normal mood and affect. Her behavior is normal.  Vitals reviewed.       Assessment & Plan:   Encounter Diagnoses  Name Primary?  . Right shoulder pain, unspecified chronicity Yes  . Urge incontinence of urine     Discussed medications for her bladder.  [name] is available from [facility] but since she is leaving tomorrow for 6 weeks, now is not the time to try to arrange this.   Pt is given rx for ditropan and coupon that she can try to see if she gets some relief.   Pt blood pressure is good today  Pt counseled to continue to move the shoulder to prevent it from freezing  Pt to follow up in 6 wk when returns from [place].  Pt was given hard copy of her shoulder MRI in case she needs it while in [place]

## 2017-09-19 ENCOUNTER — Ambulatory Visit (INDEPENDENT_AMBULATORY_CARE_PROVIDER_SITE_OTHER): Payer: Self-pay | Admitting: Orthopedic Surgery

## 2017-09-19 ENCOUNTER — Telehealth: Payer: Self-pay | Admitting: Radiology

## 2017-09-19 VITALS — BP 135/87 | HR 79 | Ht 65.0 in | Wt 215.0 lb

## 2017-09-19 DIAGNOSIS — M75101 Unspecified rotator cuff tear or rupture of right shoulder, not specified as traumatic: Secondary | ICD-10-CM

## 2017-09-19 MED ORDER — DICLOFENAC SODIUM 75 MG PO TBEC: 75.0000 mg | DELAYED_RELEASE_TABLET | Freq: Two times a day (BID) | ORAL | 0 refills | Status: DC | PRN

## 2017-09-19 MED ORDER — CYCLOBENZAPRINE HCL 10 MG PO TABS: 10.0000 mg | ORAL_TABLET | Freq: Three times a day (TID) | ORAL | 0 refills | Status: DC | PRN

## 2017-09-19 MED ORDER — HYDROCODONE-ACETAMINOPHEN 5-325 MG PO TABS: 1.0000 | ORAL_TABLET | Freq: Four times a day (QID) | ORAL | 0 refills | Status: DC | PRN

## 2017-09-19 NOTE — Patient Instructions (Addendum)
You have received an injection of steroids into the joint. 15% of patients will have increased pain within the 24 hours postinjection.   This is transient and will go away.   We recommend that you use ice packs on the injection site for 20 minutes every 2 hours and extra strength Tylenol 2 tablets every 8 as needed until the pain resolves.  If you continue to have pain after taking the Tylenol and using the ice please call the office for further instructions.  Rotator Cuff Injury Rotator cuff injury is any type of injury to the set of muscles and tendons that make up the stabilizing unit of your shoulder. This unit holds the ball of your upper arm bone (humerus) in the socket of your shoulder blade (scapula). What are the causes? Injuries to your rotator cuff most commonly come from sports or activities that cause your arm to be moved repeatedly over your head. Examples of this include throwing, weight lifting, swimming, or racquet sports. Long lasting (chronic) irritation of your rotator cuff can cause soreness and swelling (inflammation), bursitis, and eventual damage to your tendons, such as a tear (rupture). What are the signs or symptoms? Acute rotator cuff tear:  Sudden tearing sensation followed by severe pain shooting from your upper shoulder down your arm toward your elbow.  Decreased range of motion of your shoulder because of pain and muscle spasm.  Severe pain.  Inability to raise your arm out to the side because of pain and loss of muscle power (large tears).  Chronic rotator cuff tear:  Pain that usually is worse at night and may interfere with sleep.  Gradual weakness and decreased shoulder motion as the pain worsens.  Decreased range of motion.  Rotator cuff tendinitis:  Deep ache in your shoulder and the outside upper arm over your shoulder.  Pain that comes on gradually and becomes worse when lifting your arm to the side or turning it inward.  How is this  diagnosed? Rotator cuff injury is diagnosed through a medical history, physical exam, and imaging exam. The medical history helps determine the type of rotator cuff injury. Your health care provider will look at your injured shoulder, feel the injured area, and ask you to move your shoulder in different positions. X-ray exams typically are done to rule out other causes of shoulder pain, such as fractures. MRI is the exam of choice for the most severe shoulder injuries because the images show muscles and tendons. How is this treated? Chronic tear:  Medicine for pain, such as acetaminophen or ibuprofen.  Physical therapy and range-of-motion exercises may be helpful in maintaining shoulder function and strength.  Steroid injections into your shoulder joint.  Surgical repair of the rotator cuff if the injury does not heal with noninvasive treatment.  Acute tear:  Anti-inflammatory medicines such as ibuprofen and naproxen to help reduce pain and swelling.  A sling to help support your arm and rest your rotator cuff muscles. Long-term use of a sling is not advised. It may cause significant stiffening of the shoulder joint.  Surgery may be considered within a few weeks, especially in younger, active people, to return the shoulder to full function.  Indications for surgical treatment include the following: ? Age younger than 51 years. ? Rotator cuff tears that are complete. ? Physical therapy, rest, and anti-inflammatory medicines have been used for 6-8 weeks, with no improvement. ? Employment or sporting activity that requires constant shoulder use.  Tendinitis:  Anti-inflammatory medicines such  as ibuprofen and naproxen to help reduce pain and swelling.  A sling to help support your arm and rest your rotator cuff muscles. Long-term use of a sling is not advised. It may cause significant stiffening of the shoulder joint.  Severe tendinitis may require: ? Steroid injections into your  shoulder joint. ? Physical therapy. ? Surgery.  Follow these instructions at home:  Apply ice to your injury: ? Put ice in a plastic bag. ? Place a towel between your skin and the bag. ? Leave the ice on for 20 minutes, 2-3 times a day.  If you have a shoulder immobilizer (sling and straps), wear it until told otherwise by your health care provider.  You may want to sleep on several pillows or in a recliner at night to lessen swelling and pain.  Only take over-the-counter or prescription medicines for pain, discomfort, or fever as directed by your health care provider.  Do simple hand squeezing exercises with a soft rubber ball to decrease hand swelling. Contact a health care provider if:  Your shoulder pain increases, or new pain or numbness develops in your arm, hand, or fingers.  Your hand or fingers are colder than your other hand. Get help right away if:  Your arm, hand, or fingers are numb or tingling.  Your arm, hand, or fingers are increasingly swollen and painful, or they turn white or blue. This information is not intended to replace advice given to you by your health care provider. Make sure you discuss any questions you have with your health care provider. Document Released: 04/30/2000 Document Revised: 10/09/2015 Document Reviewed: 12/13/2012 Elsevier Interactive Patient Education  2018 [employer].

## 2017-09-19 NOTE — Progress Notes (Signed)
NEW PATIENT OFFICE VISIT   Chief Complaint  Patient presents with  . Shoulder Pain    Right shoulder pain, no injury. referred by free clinic, xrays and MRI at [facility].     MEDICAL DECISION SECTION  xrays ordered? no  My independent reading of xrays: MRI was done and I reviewed it.  It does show a partial thickness anterior distal supraspinatus tendon tear no atrophy  Plain films 2 views right shoulder, July 13, 2017 No evidence of arthritis fracture or dislocation  Impression normal x-ray right shoulder   MRI report that I reviewed IMPRESSION: 1. Small shallow bursal surface partial thickness tear of the anterior aspect of the distal supraspinatus tendon with overlying mild subacromial/subdeltoid bursitis. 2. Otherwise, essentially normal exam.     Electronically Signed   By: Lorriane Shire M.D.   On: 07/25/2017 13:51   Encounter Diagnosis  Name Primary?  Marland Kitchen Nontraumatic tear of right rotator cuff, unspecified tear extent Yes     PLAN:  The patient has had adequate medical therapy but no physical therapy or injections so we have planned for injection subacromial space  We will also send her for physical therapy  Continue her current medications hydrocodone for 1 week and then NSAIDs only  Meds ordered this encounter  Medications  . HYDROcodone-acetaminophen (NORCO/VICODIN) 5-325 MG tablet    Sig: Take 1 tablet by mouth every 6 (six) hours as needed for moderate pain.    Dispense:  28 tablet    Refill:  0  . DISCONTD: cyclobenzaprine (FLEXERIL) 10 MG tablet    Sig: Take 1 tablet (10 mg total) by mouth 3 (three) times daily as needed for muscle spasms.    Dispense:  30 tablet    Refill:  0  . diclofenac (VOLTAREN) 75 MG EC tablet    Sig: Take 1 tablet (75 mg total) by mouth 2 (two) times daily as needed.    Dispense:  30 tablet    Refill:  0  . cyclobenzaprine (FLEXERIL) 10 MG tablet    Sig: Take 1 tablet (10 mg total) by mouth 3 (three) times daily  as needed for muscle spasms.    Dispense:  30 tablet    Refill:  0   Follow-up in 8 weeks after therapy if no improvement I recommend she have surgery  Chief Complaint  Patient presents with  . Shoulder Pain    Right shoulder pain, no injury. referred by free clinic, xrays and MRI at [facility].    52 year old female presents as a free clinic referral after having over 1 year of pain in her right shoulder worsened in the last 2 months with no history of trauma she complains of periarticular right shoulder pain.  Acromial pain with decreased range of motion weakness associated with some neck discomfort.  Her pain has been controlled with hydrocodone when she is taking it as well as diclofenac and Flexeril to handle her neck discomfort.  Her neck stiffness seems to have improved with the Flexeril   Review of Systems  Constitutional: Negative for fever.  Musculoskeletal: Positive for neck pain.       Knee joint pain  Skin: Negative.   Neurological: Negative for tingling, tremors, sensory change, focal weakness and weakness.     Past Medical History:  Diagnosis Date  . Arthritis   . Cervical cancer [facility]) age 57  . GERD (gastroesophageal reflux disease)     No past surgical history on file.  Family History  Problem Relation Age of Onset  . Heart disease Mother   . Cancer Father        lung  . Heart disease Maternal Aunt   . Cancer Paternal Uncle        Lung  . Cancer Paternal Uncle        prostate cancer   Social History   Tobacco Use  . Smoking status: Never Smoker  . Smokeless tobacco: Never Used  Substance Use Topics  . Alcohol use: Yes    Alcohol/week: 0.6 oz    Types: 1 Cans of beer per week    Comment: a beer once a month  . Drug use: No   No Known Allergies  No outpatient medications have been marked as taking for the 09/19/17 encounter (Office Visit) with Carole Civil, MD.    BP 135/87   Pulse 79   Ht 5\' 5"  (1.651 m)   Wt 215 lb (97.5 kg)    BMI 35.78 kg/m   Physical Exam  Constitutional: She is oriented to person, place, and time. She appears well-developed and well-nourished. No distress.  Mesomorphic  Neurological: She is alert and oriented to person, place, and time. She displays normal reflexes. No sensory deficit. She exhibits normal muscle tone. Coordination normal.  Skin: Skin is warm and dry. Capillary refill takes less than 2 seconds. She is not diaphoretic.  Psychiatric: She has a normal mood and affect. Her behavior is normal. Judgment and thought content normal.    Right Shoulder Exam  Right shoulder exam is normal.  Tenderness  The patient is experiencing tenderness in the acromion.  Range of Motion  The patient has normal right shoulder ROM. Active abduction: 80  Passive abduction: 90  External rotation: 40  Forward flexion: 150 (Painful passive abduction throughout the arc of motion between 90 and 150 degrees)   Muscle Strength  The patient has normal right shoulder strength. Abduction: 4/5  Internal rotation: 5/5  External rotation: 5/5  Supraspinatus: 4/5  Subscapularis: 5/5  Biceps: 5/5   Tests  Apprehension: negative Cross arm: negative Impingement: positive Drop arm: negative Sulcus: absent  Other  Erythema: absent Sensation: normal Pulse: present   Left Shoulder Exam  Left shoulder exam is normal.  Tenderness  The patient is experiencing no tenderness.   Range of Motion  The patient has normal left shoulder ROM.  Muscle Strength  The patient has normal left shoulder strength.  Tests  Apprehension: negative  Other  Erythema: absent Sensation: normal Pulse: present

## 2017-09-19 NOTE — Telephone Encounter (Signed)
Pharmacy can only fill 5 d of the Hydrocodone for her acute shoulder injury   She will get #20 instead of #28 on the Hydrocodone  I have advised pharmacist this is fine, to you FYI

## 2017-09-21 ENCOUNTER — Encounter: Payer: Self-pay | Admitting: Physician Assistant

## 2017-09-21 ENCOUNTER — Ambulatory Visit: Payer: Medicaid Other | Admitting: Physician Assistant

## 2017-09-21 VITALS — BP 136/79 | HR 76 | Temp 97.9°F | Ht 65.0 in | Wt 220.0 lb

## 2017-09-21 DIAGNOSIS — N3941 Urge incontinence: Secondary | ICD-10-CM

## 2017-09-21 DIAGNOSIS — E785 Hyperlipidemia, unspecified: Secondary | ICD-10-CM

## 2017-09-21 DIAGNOSIS — M25511 Pain in right shoulder: Secondary | ICD-10-CM

## 2017-09-21 DIAGNOSIS — R030 Elevated blood-pressure reading, without diagnosis of hypertension: Secondary | ICD-10-CM

## 2017-09-21 DIAGNOSIS — Z1211 Encounter for screening for malignant neoplasm of colon: Secondary | ICD-10-CM

## 2017-09-21 DIAGNOSIS — Z1239 Encounter for other screening for malignant neoplasm of breast: Secondary | ICD-10-CM

## 2017-09-21 MED ORDER — FESOTERODINE FUMARATE ER 4 MG PO TB24: 4.0000 mg | ORAL_TABLET | Freq: Every day | ORAL | 1 refills | Status: DC

## 2017-09-21 NOTE — Progress Notes (Signed)
BP 136/79 (BP Location: Right Arm, Patient Position: Sitting, Cuff Size: Large)   Pulse 76   Temp 97.9 F (36.6 C) (Other (Comment))   Ht 5\' 5"  (1.651 m)   Wt 220 lb (99.8 kg)   SpO2 97%   BMI 36.61 kg/m    Subjective:    Patient ID: Elizabeth Moore, female    DOB: 05/21/1965, 52 y.o.   MRN: [id number]  HPI: Elizabeth Moore is a 52 y.o. female presenting on 09/21/2017 for Follow-up   HPI   Pt has been seen by orthopedics since her return from [place].  She got injection and referral to PT  Pt did not try the ditropan for her urge incontenence  Relevant past medical, surgical, family and social history reviewed and updated as indicated. Interim medical history since our last visit reviewed. Allergies and medications reviewed and updated.   Current Outpatient Medications:  .  cetirizine (ZYRTEC) 10 MG tablet, Take 10 mg by mouth daily., Disp: , Rfl:  .  cyclobenzaprine (FLEXERIL) 10 MG tablet, Take 1 tablet (10 mg total) by mouth 3 (three) times daily as needed for muscle spasms., Disp: 30 tablet, Rfl: 0 .  diclofenac (VOLTAREN) 75 MG EC tablet, Take 1 tablet (75 mg total) by mouth 2 (two) times daily as needed., Disp: 30 tablet, Rfl: 0 .  HYDROcodone-acetaminophen (NORCO/VICODIN) 5-325 MG tablet, Take 1 tablet by mouth every 6 (six) hours as needed for moderate pain., Disp: 28 tablet, Rfl: 0 .  ibuprofen (ADVIL,MOTRIN) 200 MG tablet, Take 600 mg by mouth every 8 (eight) hours as needed., Disp: , Rfl:  .  HYDROcodone-acetaminophen (NORCO) 5-325 MG tablet, Take 1 tablet by mouth every 6 (six) hours as needed for moderate pain. (Patient not taking: Reported on 09/21/2017), Disp: 20 tablet, Rfl: 0 .  oxybutynin (DITROPAN) 5 MG tablet, Take 1 tablet (5 mg total) by mouth 2 (two) times daily. (Patient not taking: Reported on 09/21/2017), Disp: 60 tablet, Rfl: 1 .  tolterodine (DETROL LA) 2 MG 24 hr capsule, Take 1 capsule (2 mg total) by mouth daily. (Patient not taking: Reported on  07/28/2017), Disp: 30 capsule, Rfl: 2   Review of Systems  Constitutional: Negative for appetite change, chills, diaphoresis, fatigue, fever and unexpected weight change.  HENT: Negative for congestion, dental problem, drooling, ear pain, facial swelling, hearing loss, mouth sores, sneezing, sore throat, trouble swallowing and voice change.   Eyes: Negative for pain, discharge, redness, itching and visual disturbance.  Respiratory: Positive for shortness of breath. Negative for cough, choking and wheezing.   Cardiovascular: Positive for leg swelling. Negative for chest pain and palpitations.  Gastrointestinal: Negative for abdominal pain, blood in stool, constipation, diarrhea and vomiting.  Endocrine: Negative for cold intolerance, heat intolerance and polydipsia.  Genitourinary: Negative for decreased urine volume, dysuria and hematuria.  Musculoskeletal: Positive for arthralgias, back pain and gait problem.  Skin: Negative for rash.  Allergic/Immunologic: Negative for environmental allergies.  Neurological: Negative for seizures, syncope, light-headedness and headaches.  Hematological: Negative for adenopathy.  Psychiatric/Behavioral: Negative for agitation, dysphoric mood and suicidal ideas. The patient is not nervous/anxious.     Per HPI unless specifically indicated above     Objective:    BP 136/79 (BP Location: Right Arm, Patient Position: Sitting, Cuff Size: Large)   Pulse 76   Temp 97.9 F (36.6 C) (Other (Comment))   Ht 5\' 5"  (1.651 m)   Wt 220 lb (99.8 kg)   SpO2 97%   BMI 36.61  kg/m   Wt Readings from Last 3 Encounters:  09/21/17 220 lb (99.8 kg)  09/19/17 215 lb (97.5 kg)  07/28/17 219 lb (99.3 kg)    Physical Exam  Constitutional: She is oriented to person, place, and time. She appears well-developed and well-nourished.  HENT:  Head: Normocephalic and atraumatic.  Neck: Neck supple.  Cardiovascular: Normal rate and regular rhythm.  Pulmonary/Chest: Effort  normal and breath sounds normal.  Abdominal: Soft. Bowel sounds are normal. She exhibits no mass. There is no hepatosplenomegaly. There is no tenderness.  Musculoskeletal: She exhibits no edema.  Lymphadenopathy:    She has no cervical adenopathy.  Neurological: She is alert and oriented to person, place, and time.  Skin: Skin is warm and dry.  Psychiatric: She has a normal mood and affect. Her behavior is normal.  Vitals reviewed.       Assessment & Plan:   Encounter Diagnoses  Name Primary?  . Urge incontinence of urine Yes  . Elevated blood pressure reading   . Right shoulder pain, unspecified chronicity   . Hyperlipidemia, unspecified hyperlipidemia type   . Screening for colon cancer   . Screening for breast cancer      -Will sign up Pt for [facility] and order toviaz for her urge incontenence -Will monitor bp- no rx today -ordered Screening mammogram -Gave iFOBT to pt for colon cancer screening -pt to [facility] up 2 month to recheck bp and urine/incontenence with labs before appt.  Pt to RTO sooner prn

## 2017-09-21 NOTE — Patient Instructions (Signed)
[facility] missing paperwork: -Letter of Support -[phone] tax form for 2018

## 2017-09-22 ENCOUNTER — Ambulatory Visit (HOSPITAL_COMMUNITY): Payer: Self-pay

## 2017-09-28 ENCOUNTER — Other Ambulatory Visit: Payer: Self-pay

## 2017-09-28 ENCOUNTER — Other Ambulatory Visit: Payer: Self-pay | Admitting: Physician Assistant

## 2017-09-28 ENCOUNTER — Ambulatory Visit (HOSPITAL_COMMUNITY): Payer: No Typology Code available for payment source | Attending: Orthopedic Surgery

## 2017-09-28 ENCOUNTER — Encounter (HOSPITAL_COMMUNITY): Payer: Self-pay

## 2017-09-28 DIAGNOSIS — G8929 Other chronic pain: Secondary | ICD-10-CM | POA: Insufficient documentation

## 2017-09-28 DIAGNOSIS — M25611 Stiffness of right shoulder, not elsewhere classified: Secondary | ICD-10-CM | POA: Insufficient documentation

## 2017-09-28 DIAGNOSIS — M25511 Pain in right shoulder: Secondary | ICD-10-CM | POA: Insufficient documentation

## 2017-09-28 DIAGNOSIS — Z1211 Encounter for screening for malignant neoplasm of colon: Secondary | ICD-10-CM

## 2017-09-28 DIAGNOSIS — R29898 Other symptoms and signs involving the musculoskeletal system: Secondary | ICD-10-CM | POA: Insufficient documentation

## 2017-09-28 LAB — IFOBT (OCCULT BLOOD): IMMUNOLOGICAL FECAL OCCULT BLOOD TEST: NEGATIVE

## 2017-09-28 NOTE — Patient Instructions (Signed)
Complete each exercise for 1-3 minutes 2-3 times a day.  SHOULDER: Flexion On Table   Place hands on table, elbows straight. Move hips away from body. Press hands down into table. Hold ___ seconds. ___ reps per set, ___ sets per day, ___ days per week  Abduction (Passive)   With arm out to side, resting on table, lower head toward arm, keeping trunk away from table. Hold ____ seconds. Repeat ____ times. Do ____ sessions per day.  Copyright  [employer]. All rights reserved.     Internal Rotation (Assistive)   Seated with elbow bent at right angle and held against side, slide arm on table surface in an inward arc. Repeat ____ times. Do ____ sessions per day. Activity: Use this motion to brush crumbs off the table.  Copyright  VHI. All rights reserved.

## 2017-09-28 NOTE — Therapy (Signed)
[place], [place], [address] Phone: [phone]   Fax:  [phone]  Occupational Therapy Evaluation  Patient Details  Name: Elizabeth Moore MRN: [id number] Date of Birth: 03/08/1966 Referring Provider: Arther Abbott, MD   Encounter Date: 09/28/2017  OT End of Session - 09/28/17 1002    Visit Number  1    Number of Visits  16    Date for OT Re-Evaluation  11/23/17 mini reassess: 10/26/17    Authorization Type  100% [facility] thru 12/21/17    OT Start Time  0817    OT Stop Time  0855    OT Time Calculation (min)  38 min    Activity Tolerance  Patient tolerated treatment well    Behavior During Therapy  [facility] for tasks assessed/performed       Past Medical History:  Diagnosis Date  . Arthritis   . Cervical cancer [facility]) age 76  . GERD (gastroesophageal reflux disease)     History reviewed. No pertinent surgical history.  There were no vitals filed for this visit.  Subjective Assessment - 09/28/17 0826    Subjective   S: I think it's been hurting for more than a year.     Pertinent History  Patient is a 52 y/o female S/P right shoulder pain caused from a partial thickness anterior distal supraspinatus tendon tear and mild bursitis. Patient reports that she has been experiencing pain for over a year. This past March is begain to hurt worse and the pain went up into her neck. Neck pain and stiffness has decreased. Dr. Aline Brochure referred patient for occupational therapy for evaluation and treatment.     Patient Stated Goals  Patient wishes to decrease pain and increase ability to use RUE as dominant extremity.     Currently in Pain?  Yes with movement: 4/10        [facility] OT Assessment - 09/28/17 0820      Assessment   Medical Diagnosis  right partial tear of RTC    Referring Provider  Arther Abbott, MD    Onset Date/Surgical Date  -- approximately 1 year ago    Hand Dominance  Right    Next MD  Visit  11/14/17    Prior Therapy  None      Precautions   Precautions  None      Restrictions   Weight Bearing Restrictions  No      Balance Screen   Has the patient fallen in the past 6 months  No      Home  Environment   Family/patient expects to be discharged to:  Private residence      Prior Function   Level of Independence  Independent    Vocation  Unemployed Filed for disability    Vocation Requirements  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       ADL   ADL comments  Difficulty raising arm above head, picking up anything 5# or above, decreased grip on door handle. Difficulty with UBD and reaching into cabinets.       Mobility   Mobility Status  Independent      Written Expression   Dominant Hand  Right      Vision - History   Baseline Vision  No visual deficits      Cognition   Overall Cognitive Status  Within Functional Limits for tasks assessed      ROM /  Strength   AROM / PROM / Strength  AROM;PROM;Strength      Palpation   Palpation comment  Max fascial restrictions in right upper arm, trapezius, and scapularis region.       AROM   Overall AROM Comments  Assessed seated. IR/er adducted    AROM Assessment Site  Shoulder    Right/Left Shoulder  Right    Right Shoulder Flexion  90 Degrees    Right Shoulder ABduction  85 Degrees    Right Shoulder Internal Rotation  90 Degrees    Right Shoulder External Rotation  61 Degrees      PROM   Overall PROM Comments  Assessed supine. IR/er adducted.    PROM Assessment Site  Shoulder    Right/Left Shoulder  Right    Right Shoulder Flexion  115 Degrees    Right Shoulder ABduction  83 Degrees    Right Shoulder Internal Rotation  90 Degrees    Right Shoulder External Rotation  60 Degrees      Strength   Overall Strength Comments  Assessed seated. IR/er adducted    Strength Assessment Site  Shoulder    Right/Left Shoulder  Right    Right Shoulder Flexion  3-/5    Right Shoulder  ABduction  3-/5    Right Shoulder Internal Rotation  5/5    Right Shoulder External Rotation  4+/5                      OT Education - 09/28/17 0843    Education provided  Yes    Education Details  table slides    Person(s) Educated  Patient    Methods  Explanation;Verbal cues;Handout    Comprehension  Verbalized understanding;Returned demonstration       OT Short Term Goals - 09/28/17 1009      OT SHORT TERM GOAL #1   Title  Patient will be educated and independent with HEP to faciliate progress in therapy and increase functional performance during tasks when using her RUE.     Time  4    Period  Weeks    Status  New    Target Date  10/26/17      OT SHORT TERM GOAL #2   Title  Patient will increase P/ROM to [facility] to increase ability to complete UBD tasks with less difficulty.     Time  4    Period  Weeks    Status  New      OT SHORT TERM GOAL #3   Title  Patient will increase RUE strength overall 3+/5 to increase ability to complete lightweight household tasks at waist level with less difficulty.     Time  4    Period  Weeks    Status  New      OT SHORT TERM GOAL #4   Title  Patient will decrease fascial restrictions to moderate amount in her RUE to increase funtional mobilty needed for daily tasks involving reaching.    Time  4    Period  Weeks    Status  New      OT SHORT TERM GOAL #5   Title  Patient will report a decreased pain level of 3/10 in RUE with movement and use.     Time  4    Period  Weeks    Status  New        OT Long Term Goals - 09/28/17 1012      OT  LONG TERM GOAL #1   Title  Patient will return to highest level of independence while using her RUE as her dominant extremity for all daily tasks.     Time  8    Period  Weeks    Status  New    Target Date  11/23/17      OT LONG TERM GOAL #2   Title  Patient will increase her A/ROM of RUE to [facility] to increase ability to complete overhead reaching tasks with less difficulty.      Time  8    Period  Weeks    Status  New      OT LONG TERM GOAL #3   Title  Patient will increase RUE strength to 4/5 overall to increase ability to complete moderate weight lifting tasks with less difficulty.     Time  8    Period  Weeks    Status  New      OT LONG TERM GOAL #4   Title  Patient will decrease fascial restrictions to min amount or less in RUE to increase functional mobility needed to complete reaching tasks.     Time  8    Period  Weeks    Status  New      OT LONG TERM GOAL #5   Title  Patient will report a decreased pain level of 2/10 or less in RUE when completing daily tasks and when sleeping at night.     Time  8    Period  Weeks    Status  New            Plan - 09/28/17 1004    Clinical Impression Statement  A: patient is a 52 y/o female S/P right shoulder pain causing increased fascial restrictions and decreased ROM and strength resulting in difficulty completing daily tasks using her right UE as dominant.     Occupational Profile and client history currently impacting functional performance  motivated to return to prior level of function    Occupational performance deficits (Please refer to evaluation for details):  ADL's;Rest and Sleep;Work    Rehab Potential  Good    Current Impairments/barriers affecting progress:  Chronic isssue as it's been longer than 1 year since onset.     OT Frequency  2x / week    OT Duration  8 weeks    OT Treatment/Interventions  Self-care/ADL training;Therapeutic exercise;Manual Therapy;Ultrasound;Therapeutic activities;DME and/or AE instruction;Cryotherapy;Electrical Stimulation;Moist Heat;Passive range of motion;Patient/family education    Plan  P: Patient will benefit from skilled OT services to increase functional performance during daily tasks when using RUE. Treatment Plan: myofascial release, manual stretching, P/ROM, AA/ROM, A/ROM, general shoulder and scapular strengthening. Modalities PRN.     Clinical Decision  Making  Several treatment options, min-mod task modification necessary    Consulted and Agree with Plan of Care  Patient       Patient will benefit from skilled therapeutic intervention in order to improve the following deficits and impairments:  Decreased strength, Pain, Decreased range of motion, Increased fascial restrictions, Impaired UE functional use  Visit Diagnosis: Other symptoms and signs involving the musculoskeletal system - Plan: Ot plan of care cert/re-cert  Chronic right shoulder pain - Plan: Ot plan of care cert/re-cert  Stiffness of right shoulder, not elsewhere classified - Plan: Ot plan of care cert/re-cert    Problem List Patient Active Problem List   Diagnosis Date Noted  . Urge incontinence of urine 07/25/2017  . 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 Hyperlipidemia 07/06/2017  . Chronic pain of both knees 07/06/2017  . Left ovarian cyst 07/06/2017   Ailene Ravel, OTR/L,CBIS  [phone]  09/28/2017, 10:18 AM  [place] [address] [place], [place], [address] Phone: [phone]   Fax:  [phone]  Name: Elizabeth Moore MRN: [id number] Date of Birth: 1965/12/28

## 2017-10-04 ENCOUNTER — Ambulatory Visit (HOSPITAL_COMMUNITY): Payer: No Typology Code available for payment source | Admitting: Occupational Therapy

## 2017-10-06 ENCOUNTER — Other Ambulatory Visit: Payer: Self-pay

## 2017-10-06 ENCOUNTER — Encounter (HOSPITAL_COMMUNITY): Payer: Self-pay

## 2017-10-06 ENCOUNTER — Ambulatory Visit (HOSPITAL_COMMUNITY): Payer: No Typology Code available for payment source

## 2017-10-06 DIAGNOSIS — M25511 Pain in right shoulder: Secondary | ICD-10-CM

## 2017-10-06 DIAGNOSIS — R29898 Other symptoms and signs involving the musculoskeletal system: Secondary | ICD-10-CM

## 2017-10-06 DIAGNOSIS — M25611 Stiffness of right shoulder, not elsewhere classified: Secondary | ICD-10-CM

## 2017-10-06 DIAGNOSIS — G8929 Other chronic pain: Secondary | ICD-10-CM

## 2017-10-06 NOTE — Therapy (Addendum)
[place], [place], [address] Phone: [phone]   Fax:  [phone]  Occupational Therapy Treatment  Patient Details  Name: Elizabeth Moore MRN: [id number] Date of Birth: [birth year] Referring Provider: Arther Abbott, MD   Encounter Date: 10/06/2017  OT End of Session - 10/06/17 1616    Visit Number  2    Number of Visits  16    Date for OT Re-Evaluation  11/23/17    Authorization Type  100% [facility] thru 12/21/17    OT Start Time  1347    OT Stop Time  1426    OT Time Calculation (min)  39 min    Activity Tolerance  Patient tolerated treatment well    Behavior During Therapy  [facility] for tasks assessed/performed       Past Medical History:  Diagnosis Date  . Arthritis   . Cervical cancer [facility]) age 76  . GERD (gastroesophageal reflux disease)     History reviewed. No pertinent surgical history.  There were no vitals filed for this visit.  Subjective Assessment - 10/06/17 1350    Subjective   S: The arm doesn't hurt unless I move it but I usually stop what I'm doing when it starts to hurt.    Pertinent History  Patient is a 52 y/o female S/P right shoulder pain caused from a partial thickness anterior distal supraspinatus tendon tear and mild bursitis. Patient reports that she has been experiencing pain for over a year. This past March is begain to hurt worse and the pain went up into her neck. Neck pain and stiffness has decreased. Dr. Aline Brochure referred patient for occupational therapy for evaluation and treatment.     Patient Stated Goals  Patient wishes to decrease pain and increase ability to use RUE as dominant extremity.     Currently in Pain?  No/denies Pt reports there is no arm currently, but pain of 5/10 with movement. She reports that she usually stops moving it when she feels pain.    Multiple Pain Sites  No         [facility] OT Assessment - 10/06/17 1353      Assessment   Medical  Diagnosis  right partial tear of RTC      Precautions   Precautions  None    Precaution Comments  avoid heavy lifting               OT Treatments/Exercises (OP) - 10/06/17 1354      Exercises   Exercises  Shoulder      Shoulder Exercises: Supine   Protraction  AAROM;10 reps    Horizontal ABduction  PROM;5 reps;AAROM;10 reps    External Rotation  PROM;5 reps;AAROM;10 reps    Internal Rotation  PROM;5 reps;AAROM;10 reps    Flexion  PROM;5 reps;AAROM;10 reps    ABduction  PROM;5 reps;AAROM;10 reps      Shoulder Exercises: Seated   Protraction  AAROM;10 reps    Horizontal ABduction  AAROM;10 reps    External Rotation  AAROM;10 reps    Internal Rotation  AAROM;10 reps    Flexion  AAROM;10 reps    Abduction  AAROM;10 reps      Manual Therapy   Manual Therapy  Myofascial release    Manual therapy comments  Myofascial release performed seperate from therapeutic exercise     Myofascial Release  myofascial release to right upper arm, trapeizus, and scapularis regions to decrease pain and fascial restrictions,  and increase joint range of motion.               OT Education - 10/06/17 1357    Education provided  Yes    Education Details  Pt given handout of evaluation and reviewed goals with therapist. HEP updated to include AA/ROM exercises.    Person(s) Educated  Patient    Methods  Explanation;Handout;Demonstration;Verbal cues    Comprehension  Verbalized understanding;Returned demonstration       OT Short Term Goals - 10/06/17 1634      OT SHORT TERM GOAL #1   Title  Patient will be educated and independent with HEP to faciliate progress in therapy and increase functional performance during tasks when using her RUE.     Time  4    Period  Weeks    Status  On-going      OT SHORT TERM GOAL #2   Title  Patient will increase P/ROM to [facility] to increase ability to complete UBD tasks with less difficulty.     Time  4    Period  Weeks    Status  On-going      OT  SHORT TERM GOAL #3   Title  Patient will increase RUE strength overall 3+/5 to increase ability to complete lightweight household tasks at waist level with less difficulty.     Time  4    Period  Weeks    Status  On-going      OT SHORT TERM GOAL #4   Title  Patient will decrease fascial restrictions to moderate amount in her RUE to increase funtional mobilty needed for daily tasks involving reaching.    Time  4    Period  Weeks    Status  On-going      OT SHORT TERM GOAL #5   Title  Patient will report a decreased pain level of 3/10 in RUE with movement and use.     Time  4    Period  Weeks    Status  On-going        OT Long Term Goals - 10/06/17 1635      OT LONG TERM GOAL #1   Title  Patient will return to highest level of independence while using her RUE as her dominant extremity for all daily tasks.     Time  8    Period  Weeks    Status  On-going      OT LONG TERM GOAL #2   Title  Patient will increase her A/ROM of RUE to [facility] to increase ability to complete overhead reaching tasks with less difficulty.     Time  8    Period  Weeks    Status  On-going      OT LONG TERM GOAL #3   Title  Patient will increase RUE strength to 4/5 overall to increase ability to complete moderate weight lifting tasks with less difficulty.     Time  8    Period  Weeks    Status  On-going      OT LONG TERM GOAL #4   Title  Patient will decrease fascial restrictions to min amount or less in RUE to increase functional mobility needed to complete reaching tasks.     Time  8    Period  Weeks    Status  On-going      OT LONG TERM GOAL #5   Title  Patient will report a decreased pain level of 2/10  or less in RUE when completing daily tasks and when sleeping at night.     Time  8    Period  Weeks    Status  On-going            Plan - 10/06/17 1358    Clinical Impression Statement  A: Pt still maintains decreased ROM, mod/maximal fascial restrictions, and decreased strength due to  RUE pain resulting in difficulty completing daily tasks using her right UE as dominant.  Pt reports she has been able to do more with the shoulder but still cannot reach shelves in her home. Pt completed AA/ROM exercises in supine and seated. HEP updated to include these exercises.    Plan  P: Continue with myofascial release to reduce fascial restrictions for increased ROM. Continue with AA/ROM. Add wall wash and pulleys    Clinical Decision Making  --    Consulted and Agree with Plan of Care  Patient       Patient will benefit from skilled therapeutic intervention in order to improve the following deficits and impairments:  Decreased strength, Pain, Decreased range of motion, Increased fascial restrictions, Impaired UE functional use  Visit Diagnosis: Other symptoms and signs involving the musculoskeletal system  Chronic right shoulder pain  Stiffness of right shoulder, not elsewhere classified    Problem List Patient Active Problem List   Diagnosis Date Noted  . Urge incontinence of urine 07/25/2017  . 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 Hyperlipidemia 07/06/2017  . Chronic pain of both knees 07/06/2017  . Left ovarian cyst 07/06/2017    Roderic Palau, OT student 10/06/2017, 5:29 PM  [place] [address] [place], [place], [address] Phone: [phone]   Fax:  [phone]  Name: Elizabeth Moore MRN: [id number] Date of Birth: 1965-09-01

## 2017-10-06 NOTE — Patient Instructions (Signed)

## 2017-10-12 ENCOUNTER — Other Ambulatory Visit: Payer: Self-pay

## 2017-10-12 ENCOUNTER — Encounter (HOSPITAL_COMMUNITY): Payer: Self-pay

## 2017-10-12 ENCOUNTER — Ambulatory Visit (HOSPITAL_COMMUNITY): Payer: No Typology Code available for payment source

## 2017-10-12 DIAGNOSIS — M25611 Stiffness of right shoulder, not elsewhere classified: Secondary | ICD-10-CM

## 2017-10-12 DIAGNOSIS — R29898 Other symptoms and signs involving the musculoskeletal system: Secondary | ICD-10-CM

## 2017-10-12 DIAGNOSIS — G8929 Other chronic pain: Secondary | ICD-10-CM

## 2017-10-12 DIAGNOSIS — M25511 Pain in right shoulder: Secondary | ICD-10-CM

## 2017-10-12 NOTE — Therapy (Signed)
[name], [place], [address] Phone: [phone]   Fax:  [phone]  Occupational Therapy Treatment  Patient Details  Name: Elizabeth Moore MRN: [id number] Date of Birth: [birth year] Referring Provider: Arther Abbott, MD   Encounter Date: 10/12/2017  OT End of Session - 10/12/17 0828    Visit Number  3    Number of Visits  16    Date for OT Re-Evaluation  11/23/17    Authorization Type  100% [facility] thru 12/21/17    OT Start Time  0818    OT Stop Time  0858    OT Time Calculation (min)  40 min    Activity Tolerance  Patient tolerated treatment well    Behavior During Therapy  [facility] for tasks assessed/performed       Past Medical History:  Diagnosis Date  . Arthritis   . Cervical cancer [facility]) age 70  . GERD (gastroesophageal reflux disease)     History reviewed. No pertinent surgical history.  There were no vitals filed for this visit.  Subjective Assessment - 10/12/17 0820    Subjective   S: I woke up in the middle of the night and it was hurting so I took a pain pill.    Currently in Pain?  Yes    Pain Score  4     Pain Location  Shoulder    Pain Orientation  Right    Pain Descriptors / Indicators  Aching;Sore    Pain Type  Acute pain    Pain Radiating Towards  N/A    Pain Onset  More than a month ago    Pain Frequency  Intermittent    Aggravating Factors   movement    Pain Relieving Factors  pain medication    Effect of Pain on Daily Activities  Moderate effect on daily activities. Patient reports she stops using her arm when she feels pain.    Multiple Pain Sites  No         [facility] OT Assessment - 10/12/17 0823      Assessment   Medical Diagnosis  right partial tear of RTC      Precautions   Precautions  None    Precaution Comments  avoid heavy lifting               OT Treatments/Exercises (OP) - 10/12/17 0824      Exercises   Exercises  Shoulder      Shoulder  Exercises: Supine   Protraction  PROM;5 reps;AAROM;12 reps    Horizontal ABduction  PROM;5 reps;AAROM;12 reps    External Rotation  PROM;5 reps;AAROM;12 reps    Internal Rotation  PROM;5 reps;AAROM;12 reps    Flexion  PROM;5 reps;AAROM;12 reps    ABduction  AROM;5 reps;AAROM;12 reps      Shoulder Exercises: Standing   Protraction  AAROM;10 reps    Horizontal ABduction  AAROM;10 reps    External Rotation  AAROM;10 reps    Internal Rotation  AAROM;10 reps    Flexion  AAROM;10 reps    ABduction  AAROM;10 reps      Shoulder Exercises: Pulleys   Flexion  1 minute    ABduction  1 minute      Shoulder Exercises: ROM/Strengthening   Wall Wash  1'      Manual Therapy   Manual Therapy  Myofascial release    Manual therapy comments  Myofascial release performed seperate from therapeutic exercise  Myofascial Release  myofascial release to right upper arm, trapeizus, and scapularis regions to decrease pain and fascial restrictions, and increase joint range of motion.               OT Education - 10/12/17 1829    Education provided  No       OT Short Term Goals - 10/06/17 1634      OT SHORT TERM GOAL #1   Title  Patient will be educated and independent with HEP to faciliate progress in therapy and increase functional performance during tasks when using her RUE.     Time  4    Period  Weeks    Status  On-going      OT SHORT TERM GOAL #2   Title  Patient will increase P/ROM to [facility] to increase ability to complete UBD tasks with less difficulty.     Time  4    Period  Weeks    Status  On-going      OT SHORT TERM GOAL #3   Title  Patient will increase RUE strength overall 3+/5 to increase ability to complete lightweight household tasks at waist level with less difficulty.     Time  4    Period  Weeks    Status  On-going      OT SHORT TERM GOAL #4   Title  Patient will decrease fascial restrictions to moderate amount in her RUE to increase funtional mobilty needed for  daily tasks involving reaching.    Time  4    Period  Weeks    Status  On-going      OT SHORT TERM GOAL #5   Title  Patient will report a decreased pain level of 3/10 in RUE with movement and use.     Time  4    Period  Weeks    Status  On-going        OT Long Term Goals - 10/06/17 1635      OT LONG TERM GOAL #1   Title  Patient will return to highest level of independence while using her RUE as her dominant extremity for all daily tasks.     Time  8    Period  Weeks    Status  On-going      OT LONG TERM GOAL #2   Title  Patient will increase her A/ROM of RUE to [facility] to increase ability to complete overhead reaching tasks with less difficulty.     Time  8    Period  Weeks    Status  On-going      OT LONG TERM GOAL #3   Title  Patient will increase RUE strength to 4/5 overall to increase ability to complete moderate weight lifting tasks with less difficulty.     Time  8    Period  Weeks    Status  On-going      OT LONG TERM GOAL #4   Title  Patient will decrease fascial restrictions to min amount or less in RUE to increase functional mobility needed to complete reaching tasks.     Time  8    Period  Weeks    Status  On-going      OT LONG TERM GOAL #5   Title  Patient will report a decreased pain level of 2/10 or less in RUE when completing daily tasks and when sleeping at night.     Time  8    Period  Weeks    Status  On-going            Plan - 10/12/17 8592    Clinical Impression Statement  A: Pt motivated to participate in therapy in order to prevent having surgery. She reports she has been doing the HEP. Moderate fascial restrictions palpated this session. Pt able to increase reps for supine AA/ROM exercises and add the pulleys and wall wash. Less VC needed this session for form and technique.    Plan  P: Continue with myofascial release to reduce fascial restrictions for increased ROM. Progress to A/ROM in supine and add stability exercises such as proximal  shoulder strengthening supine and seated.     Consulted and Agree with Plan of Care  Patient       Patient will benefit from skilled therapeutic intervention in order to improve the following deficits and impairments:  Decreased strength, Pain, Decreased range of motion, Increased fascial restrictions, Impaired UE functional use  Visit Diagnosis: Other symptoms and signs involving the musculoskeletal system  Chronic right shoulder pain  Stiffness of right shoulder, not elsewhere classified    Problem List Patient Active Problem List   Diagnosis Date Noted  . Urge incontinence of urine 07/25/2017  . 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 Hyperlipidemia 07/06/2017  . Chronic pain of both knees 07/06/2017  . Left ovarian cyst 07/06/2017    Roderic Palau, OT student 10/12/2017, 9:07 AM  [place], [place], [address] Phone: [phone]   Fax:  [phone]  Name: Elizabeth Moore MRN: [id number] Date of Birth: 06-Jan-1966

## 2017-10-13 ENCOUNTER — Telehealth (HOSPITAL_COMMUNITY): Payer: Self-pay | Admitting: Physician Assistant

## 2017-10-13 ENCOUNTER — Encounter (HOSPITAL_COMMUNITY): Payer: No Typology Code available for payment source | Admitting: Occupational Therapy

## 2017-10-13 NOTE — Telephone Encounter (Signed)
10/13/17  she got invited to the beach last minute and won't be here today

## 2017-10-17 ENCOUNTER — Encounter (HOSPITAL_COMMUNITY): Payer: Self-pay | Admitting: Specialist

## 2017-10-17 ENCOUNTER — Ambulatory Visit (HOSPITAL_COMMUNITY): Payer: No Typology Code available for payment source | Attending: Orthopedic Surgery | Admitting: Specialist

## 2017-10-17 DIAGNOSIS — M25511 Pain in right shoulder: Secondary | ICD-10-CM | POA: Insufficient documentation

## 2017-10-17 DIAGNOSIS — M25611 Stiffness of right shoulder, not elsewhere classified: Secondary | ICD-10-CM

## 2017-10-17 DIAGNOSIS — G8929 Other chronic pain: Secondary | ICD-10-CM | POA: Insufficient documentation

## 2017-10-17 DIAGNOSIS — R29898 Other symptoms and signs involving the musculoskeletal system: Secondary | ICD-10-CM | POA: Insufficient documentation

## 2017-10-17 NOTE — Therapy (Signed)
[place], [place], [address] Phone: [phone]   Fax:  [phone]  Occupational Therapy Treatment  Patient Details  Name: Elizabeth Moore MRN: [id number] Date of Birth: [birth year] Referring Provider: Arther Abbott, MD   Encounter Date: 10/17/2017  OT End of Session - 10/17/17 0922    Visit Number  4    Number of Visits  16    Date for OT Re-Evaluation  11/23/17    Authorization Type  100% [facility] thru 12/21/17    OT Start Time  0906    OT Stop Time  0945    OT Time Calculation (min)  39 min    Activity Tolerance  Patient tolerated treatment well    Behavior During Therapy  [facility] for tasks assessed/performed       Past Medical History:  Diagnosis Date  . Arthritis   . Cervical cancer [facility]) age 52  . GERD (gastroesophageal reflux disease)     History reviewed. No pertinent surgical history.  There were no vitals filed for this visit.  Subjective Assessment - 10/17/17 0922    Subjective   S:  I went to the beach this weekend.  I am a little sore due to sleeping on my arm funny and riding in the car.     Currently in Pain?  Yes    Pain Score  6     Pain Location  Shoulder    Pain Orientation  Right;Anterior    Pain Descriptors / Indicators  Aching         [facility] OT Assessment - 10/17/17 0001      Assessment   Medical Diagnosis  right partial tear of RTC      Precautions   Precautions  None    Precaution Comments  avoid heavy lifting               OT Treatments/Exercises (OP) - 10/17/17 0001      Exercises   Exercises  Shoulder      Shoulder Exercises: Supine   Protraction  PROM;5 reps;AROM;10 reps    Horizontal ABduction  PROM;5 reps;AROM;10 reps    External Rotation  PROM;5 reps;AROM;10 reps    Internal Rotation  PROM;5 reps;AROM;10 reps    Flexion  PROM;5 reps;AROM;10 reps    ABduction  PROM;5 reps;AROM;10 reps    Other Supine Exercises  serratus anterior punch 10  times       Shoulder Exercises: Standing   Protraction  AAROM;12 reps    Horizontal ABduction  AAROM;12 reps    External Rotation  AAROM;12 reps    Internal Rotation  AAROM;12 reps    Flexion  AAROM;12 reps    ABduction  AAROM;12 reps    Extension  Theraband;10 reps    Theraband Level (Shoulder Extension)  Level 2 (Red)    Row  Theraband;10 reps    Theraband Level (Shoulder Row)  Level 2 (Red)    Retraction  Theraband;10 reps    Theraband Level (Shoulder Retraction)  Level 2 (Red)      Shoulder Exercises: ROM/Strengthening   UBE (Upper Arm Bike)  2 minutes at level 1.0 in reverse    Wall Wash  1'    Thumb Tacks  1'    Proximal Shoulder Strengthening, Supine  10 times each without rest breaks, min vg for technique     Proximal Shoulder Strengthening, Seated  10 times each without resting     Prot/Ret//Elev/Dep  1'  Manual Therapy   Manual Therapy  Myofascial release    Manual therapy comments  Myofascial release performed seperate from therapeutic exercise     Myofascial Release  myofascial release to right upper arm, trapeizus, and scapularis regions to decrease pain and fascial restrictions, and increase joint range of motion.                 OT Short Term Goals - 10/06/17 1634      OT SHORT TERM GOAL #1   Title  Patient will be educated and independent with HEP to faciliate progress in therapy and increase functional performance during tasks when using her RUE.     Time  4    Period  Weeks    Status  On-going      OT SHORT TERM GOAL #2   Title  Patient will increase P/ROM to [facility] to increase ability to complete UBD tasks with less difficulty.     Time  4    Period  Weeks    Status  On-going      OT SHORT TERM GOAL #3   Title  Patient will increase RUE strength overall 3+/5 to increase ability to complete lightweight household tasks at waist level with less difficulty.     Time  4    Period  Weeks    Status  On-going      OT SHORT TERM GOAL #4   Title   Patient will decrease fascial restrictions to moderate amount in her RUE to increase funtional mobilty needed for daily tasks involving reaching.    Time  4    Period  Weeks    Status  On-going      OT SHORT TERM GOAL #5   Title  Patient will report a decreased pain level of 3/10 in RUE with movement and use.     Time  4    Period  Weeks    Status  On-going        OT Long Term Goals - 10/06/17 1635      OT LONG TERM GOAL #1   Title  Patient will return to highest level of independence while using her RUE as her dominant extremity for all daily tasks.     Time  8    Period  Weeks    Status  On-going      OT LONG TERM GOAL #2   Title  Patient will increase her A/ROM of RUE to [facility] to increase ability to complete overhead reaching tasks with less difficulty.     Time  8    Period  Weeks    Status  On-going      OT LONG TERM GOAL #3   Title  Patient will increase RUE strength to 4/5 overall to increase ability to complete moderate weight lifting tasks with less difficulty.     Time  8    Period  Weeks    Status  On-going      OT LONG TERM GOAL #4   Title  Patient will decrease fascial restrictions to min amount or less in RUE to increase functional mobility needed to complete reaching tasks.     Time  8    Period  Weeks    Status  On-going      OT LONG TERM GOAL #5   Title  Patient will report a decreased pain level of 2/10 or less in RUE when completing daily tasks and when sleeping at night.  Time  8    Period  Weeks    Status  On-going            Plan - 10/17/17 0945    Clinical Impression Statement  A:  Patient demonstrating full AA/ROM in supine, therefore transitioned to A/ROM in supine.  continued manual therapy intervention in order to decrease pain wtih end range mobility.  added proximal shoulder stability exercises for improved support required for overhead reaching.     Plan  P:  Continue manual therapy intervention in order to decrease pain with end  range functional activities.  Attempt A/ROM in seated.  add x to v and w arms for improved scapular stabiltiy.        Patient will benefit from skilled therapeutic intervention in order to improve the following deficits and impairments:  Decreased strength, Pain, Decreased range of motion, Increased fascial restrictions, Impaired UE functional use  Visit Diagnosis: Other symptoms and signs involving the musculoskeletal system  Chronic right shoulder pain  Stiffness of right shoulder, not elsewhere classified    Problem List Patient Active Problem List   Diagnosis Date Noted  . Urge incontinence of urine 07/25/2017  . 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 Hyperlipidemia 07/06/2017  . Chronic pain of both knees 07/06/2017  . Left ovarian cyst 07/06/2017    Vangie Bicker, Gans, OTR/L [phone]  10/17/2017, 9:47 AM  [place] [address] [place], [place], [address] Phone: [phone]   Fax:  [phone]  Name: Elizabeth Moore MRN: [id number] Date of Birth: [birth year]

## 2017-10-18 DIAGNOSIS — Z029 Encounter for administrative examinations, unspecified: Secondary | ICD-10-CM

## 2017-10-19 ENCOUNTER — Encounter (HOSPITAL_COMMUNITY): Payer: Self-pay

## 2017-10-19 ENCOUNTER — Ambulatory Visit (HOSPITAL_COMMUNITY): Payer: No Typology Code available for payment source

## 2017-10-19 ENCOUNTER — Other Ambulatory Visit: Payer: Self-pay

## 2017-10-19 DIAGNOSIS — M25511 Pain in right shoulder: Secondary | ICD-10-CM

## 2017-10-19 DIAGNOSIS — G8929 Other chronic pain: Secondary | ICD-10-CM

## 2017-10-19 DIAGNOSIS — R29898 Other symptoms and signs involving the musculoskeletal system: Secondary | ICD-10-CM

## 2017-10-19 DIAGNOSIS — M25611 Stiffness of right shoulder, not elsewhere classified: Secondary | ICD-10-CM

## 2017-10-19 NOTE — Therapy (Signed)
[place], [place], [address] Phone: [phone]   Fax:  [phone]  Occupational Therapy Treatment  Patient Details  Name: Elizabeth Moore MRN: [id number] Date of Birth: 1965/07/22 Referring Provider: Arther Abbott, MD   Encounter Date: 10/19/2017  OT End of Session - 10/19/17 1023    Visit Number  5    Number of Visits  16    Date for OT Re-Evaluation  11/23/17    Authorization Type  [employer]% [facility] thru 12/21/17    OT Start Time  0945    OT Stop Time  1030    OT Time Calculation (min)  45 min    Activity Tolerance  Patient tolerated treatment well    Behavior During Therapy  [facility] for tasks assessed/performed       Past Medical History:  Diagnosis Date  . Arthritis   . Cervical cancer [facility]) age 69  . GERD (gastroesophageal reflux disease)     History reviewed. No pertinent surgical history.  There were no vitals filed for this visit.  Subjective Assessment - 10/19/17 1007    Subjective   S: I was cleaning up the yard after the storm so I think I'm sore from that.     Currently in Pain?  Yes    Pain Score  4     Pain Location  Shoulder    Pain Orientation  Right    Pain Descriptors / Indicators  Sore    Pain Type  Acute pain    Pain Radiating Towards  N/A    Pain Onset  Yesterday    Pain Frequency  Intermittent    Aggravating Factors   Cleaning up yard from the storm and moving loveseat recliner couch.    Pain Relieving Factors  pain medication    Effect of Pain on Daily Activities  Min effect    Multiple Pain Sites  No         [facility] OT Assessment - 10/19/17 1008      Assessment   Medical Diagnosis  right partial tear of RTC      Precautions   Precautions  None    Precaution Comments  avoid heavy lifting               OT Treatments/Exercises (OP) - 10/19/17 1008      Exercises   Exercises  Shoulder      Shoulder Exercises: Supine   Protraction  PROM;5  reps;AROM;12 reps    Horizontal ABduction  PROM;5 reps;AROM;12 reps    External Rotation  PROM;5 reps;AROM;12 reps    Internal Rotation  PROM;5 reps;AROM;12 reps    Flexion  PROM;5 reps;AROM;12 reps    ABduction  PROM;5 reps;AROM;12 reps      Shoulder Exercises: Standing   Protraction  AROM;12 reps    Horizontal ABduction  AROM;12 reps    External Rotation  AROM;12 reps    Internal Rotation  AROM;12 reps    Flexion  AROM;12 reps    ABduction  AROM;12 reps    Extension  Theraband;12 reps    Theraband Level (Shoulder Extension)  Level 2 (Red)    Row  Theraband;12 reps    Theraband Level (Shoulder Row)  Level 2 (Red)    Retraction  Theraband;12 reps    Theraband Level (Shoulder Retraction)  Level 2 (Red)      Shoulder Exercises: ROM/Strengthening   UBE (Upper Arm Bike)  level 1 2' forward 2' reverse  pace: 3.5-4.0    Over Head Lace  2'    Proximal Shoulder Strengthening, Supine  12X no rest breaks    Proximal Shoulder Strengthening, Seated  12X with no rest breaks      Manual Therapy   Manual Therapy  Myofascial release    Manual therapy comments  Myofascial release performed seperate from therapeutic exercise     Myofascial Release  myofascial release to right upper arm, trapeizus, and scapularis regions to decrease pain and fascial restrictions, and increase joint range of motion.               OT Education - 10/19/17 1021    Education provided  Yes    Education Details  Progressed to A/ROM shoulder exercises.     Person(s) Educated  Patient    Methods  Explanation;Demonstration;Handout    Comprehension  Verbalized understanding;Returned demonstration       OT Short Term Goals - 10/06/17 1634      OT SHORT TERM GOAL #1   Title  Patient will be educated and independent with HEP to faciliate progress in therapy and increase functional performance during tasks when using her RUE.     Time  4    Period  Weeks    Status  On-going      OT SHORT TERM GOAL #2   Title   Patient will increase P/ROM to [facility] to increase ability to complete UBD tasks with less difficulty.     Time  4    Period  Weeks    Status  On-going      OT SHORT TERM GOAL #3   Title  Patient will increase RUE strength overall 3+/5 to increase ability to complete lightweight household tasks at waist level with less difficulty.     Time  4    Period  Weeks    Status  On-going      OT SHORT TERM GOAL #4   Title  Patient will decrease fascial restrictions to moderate amount in her RUE to increase funtional mobilty needed for daily tasks involving reaching.    Time  4    Period  Weeks    Status  On-going      OT SHORT TERM GOAL #5   Title  Patient will report a decreased pain level of 3/10 in RUE with movement and use.     Time  4    Period  Weeks    Status  On-going        OT Long Term Goals - 10/06/17 1635      OT LONG TERM GOAL #1   Title  Patient will return to highest level of independence while using her RUE as her dominant extremity for all daily tasks.     Time  8    Period  Weeks    Status  On-going      OT LONG TERM GOAL #2   Title  Patient will increase her A/ROM of RUE to [facility] to increase ability to complete overhead reaching tasks with less difficulty.     Time  8    Period  Weeks    Status  On-going      OT LONG TERM GOAL #3   Title  Patient will increase RUE strength to 4/5 overall to increase ability to complete moderate weight lifting tasks with less difficulty.     Time  8    Period  Weeks    Status  On-going  OT LONG TERM GOAL #4   Title  Patient will decrease fascial restrictions to min amount or less in RUE to increase functional mobility needed to complete reaching tasks.     Time  8    Period  Weeks    Status  On-going      OT LONG TERM GOAL #5   Title  Patient will report a decreased pain level of 2/10 or less in RUE when completing daily tasks and when sleeping at night.     Time  8    Period  Weeks    Status  On-going             Plan - 10/19/17 1023    Clinical Impression Statement  A: patient presents with min fascial restrictions in anterior shoulder and upper trapezius. Manual therapy techniques completed to address. Progressed to A/ROM supine and standing with HEP. VC needed for form and technique. patient reports some fatigue and soreness although able to complete all recommended exercises. Overhead lacing added to focus on shoulder stability and strength.     Plan  P: follow up on updated HEP. Add ball on the wall. Continue with manual therapy for trigger points and fascial restrictions.     Consulted and Agree with Plan of Care  Patient       Patient will benefit from skilled therapeutic intervention in order to improve the following deficits and impairments:  Decreased strength, Pain, Decreased range of motion, Increased fascial restrictions, Impaired UE functional use  Visit Diagnosis: Other symptoms and signs involving the musculoskeletal system  Chronic right shoulder pain  Stiffness of right shoulder, not elsewhere classified    Problem List Patient Active Problem List   Diagnosis Date Noted  . Urge incontinence of urine 07/25/2017  . 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 Hyperlipidemia 07/06/2017  . Chronic pain of both knees 07/06/2017  . Left ovarian cyst 07/06/2017   Ailene Ravel, OTR/L,CBIS  [phone]  10/19/2017, 10:27 AM  [facility] [address] [place], [place], [address] Phone: [phone]   Fax:  [phone]  Name: Elizabeth Moore MRN: [id number] Date of Birth: Jul 01, 1965

## 2017-10-19 NOTE — Patient Instructions (Signed)
Repeat all exercises 10-15 times, 1-2 times per day.  1) Shoulder Protraction    Begin with elbows by your side, slowly "punch" straight out in front of you.      2) Shoulder Flexion  Standing:         Begin with arms at your side with thumbs pointed up, slowly raise both arms up and forward towards overhead.       3) Horizontal abduction/adduction   Standing:           Begin with arms straight out in front of you, bring out to the side in at "T" shape. Keep arms straight entire time.        4) Internal & External Rotation    *No band* -Stand with elbows at the side and elbows bent 90 degrees. Move your forearms away from your body, then bring back inward toward the body.     5) Shoulder Abduction   Standing:       Lying on your back begin with your arms flat on the table next to your side. Slowly move your arms out to the side so that they go overhead, in a jumping jack or snow angel movement.    6) X to V arms (cheerleader move):  Begin with arms straight down, crossed in front of body in an "X". Keeping arms crossed, lift arms straight up overhead. Then spread arms apart into a "V" shape.  Bring back together into x and lower down to starting position.    

## 2017-10-24 ENCOUNTER — Ambulatory Visit (HOSPITAL_COMMUNITY): Payer: No Typology Code available for payment source | Admitting: Specialist

## 2017-10-24 ENCOUNTER — Telehealth (HOSPITAL_COMMUNITY): Payer: Self-pay | Admitting: Physician Assistant

## 2017-10-24 NOTE — Telephone Encounter (Signed)
10/24/17  her daughter is here from [place] and they are going to take care of some things for her today

## 2017-10-26 ENCOUNTER — Telehealth (HOSPITAL_COMMUNITY): Payer: Self-pay | Admitting: Occupational Therapy

## 2017-10-26 ENCOUNTER — Ambulatory Visit: Payer: Self-pay | Admitting: Obstetrics and Gynecology

## 2017-10-26 NOTE — Telephone Encounter (Signed)
Patient came on wrong day.

## 2017-10-27 ENCOUNTER — Encounter (HOSPITAL_COMMUNITY): Payer: Self-pay | Admitting: Occupational Therapy

## 2017-10-27 ENCOUNTER — Ambulatory Visit (HOSPITAL_COMMUNITY): Payer: No Typology Code available for payment source | Admitting: Occupational Therapy

## 2017-10-27 DIAGNOSIS — G8929 Other chronic pain: Secondary | ICD-10-CM

## 2017-10-27 DIAGNOSIS — M25511 Pain in right shoulder: Secondary | ICD-10-CM

## 2017-10-27 DIAGNOSIS — M25611 Stiffness of right shoulder, not elsewhere classified: Secondary | ICD-10-CM

## 2017-10-27 DIAGNOSIS — R29898 Other symptoms and signs involving the musculoskeletal system: Secondary | ICD-10-CM

## 2017-10-27 NOTE — Therapy (Signed)
[place], [place], [address] Phone: [phone]   Fax:  [phone]  Occupational Therapy Treatment  Patient Details  Name: Elizabeth Moore MRN: [id number] Date of Birth: 08-30-1965 Referring Provider: Arther Abbott, MD   Encounter Date: 10/27/2017  OT End of Session - 10/27/17 1010    Visit Number  6    Number of Visits  16    Date for OT Re-Evaluation  11/23/17    Authorization Type  100% [facility] thru 12/21/17    OT Start Time  0927    OT Stop Time  1010    OT Time Calculation (min)  43 min    Activity Tolerance  Patient tolerated treatment well    Behavior During Therapy  [facility] for tasks assessed/performed       Past Medical History:  Diagnosis Date  . Arthritis   . Cervical cancer [facility]) age 52  . GERD (gastroesophageal reflux disease)     History reviewed. No pertinent surgical history.  There were no vitals filed for this visit.  Subjective Assessment - 10/27/17 0925    Subjective   S: I lifted a bag of dirt yesterday so I can feel it a little bit.     Currently in Pain?  Yes    Pain Score  3     Pain Location  Shoulder    Pain Orientation  Right    Pain Descriptors / Indicators  Sore    Pain Type  Acute pain    Pain Radiating Towards  n/a    Pain Onset  Yesterday    Pain Frequency  Intermittent    Aggravating Factors   lifting items    Pain Relieving Factors  pain medication    Effect of Pain on Daily Activities  min effect    Multiple Pain Sites  No         [facility] OT Assessment - 10/27/17 0925      Assessment   Medical Diagnosis  right partial tear of RTC      Precautions   Precautions  None    Precaution Comments  avoid heavy lifting               OT Treatments/Exercises (OP) - 10/27/17 0929      Exercises   Exercises  Shoulder      Shoulder Exercises: Supine   Protraction  PROM;5 reps;AROM;12 reps    Horizontal ABduction  PROM;5 reps;AROM;12 reps    External Rotation  PROM;5 reps;AROM;12 reps    Internal Rotation  PROM;5 reps;AROM;12 reps    Flexion  PROM;5 reps;AROM;12 reps    ABduction  PROM;5 reps;AROM;12 reps      Shoulder Exercises: Standing   Protraction  AROM;12 reps    Horizontal ABduction  AROM;12 reps    External Rotation  AROM;12 reps    Internal Rotation  AROM;12 reps    Flexion  AROM;12 reps    ABduction  AROM;12 reps    Extension  Theraband;12 reps    Theraband Level (Shoulder Extension)  Level 2 (Red)    Row  Theraband;12 reps    Theraband Level (Shoulder Row)  Level 2 (Red)    Retraction  Theraband;12 reps    Theraband Level (Shoulder Retraction)  Level 2 (Red)      Shoulder Exercises: ROM/Strengthening   UBE (Upper Arm Bike)  Level 1 3' reverse pace: 4.0    X to V Arms  12X  Proximal Shoulder Strengthening, Supine  12X no rest breaks    Proximal Shoulder Strengthening, Seated  12X with no rest breaks    Ball on Wall  1' flexion 1' abduction      Manual Therapy   Manual Therapy  Myofascial release    Manual therapy comments  Myofascial release performed seperate from therapeutic exercise     Myofascial Release  myofascial release to right upper arm, trapeizus, and scapularis regions to decrease pain and fascial restrictions, and increase joint range of motion.                 OT Short Term Goals - 10/06/17 1634      OT SHORT TERM GOAL #1   Title  Patient will be educated and independent with HEP to faciliate progress in therapy and increase functional performance during tasks when using her RUE.     Time  4    Period  Weeks    Status  On-going      OT SHORT TERM GOAL #2   Title  Patient will increase P/ROM to [facility] to increase ability to complete UBD tasks with less difficulty.     Time  4    Period  Weeks    Status  On-going      OT SHORT TERM GOAL #3   Title  Patient will increase RUE strength overall 3+/5 to increase ability to complete lightweight household tasks at waist level with  less difficulty.     Time  4    Period  Weeks    Status  On-going      OT SHORT TERM GOAL #4   Title  Patient will decrease fascial restrictions to moderate amount in her RUE to increase funtional mobilty needed for daily tasks involving reaching.    Time  4    Period  Weeks    Status  On-going      OT SHORT TERM GOAL #5   Title  Patient will report a decreased pain level of 3/10 in RUE with movement and use.     Time  4    Period  Weeks    Status  On-going        OT Long Term Goals - 10/06/17 1635      OT LONG TERM GOAL #1   Title  Patient will return to highest level of independence while using her RUE as her dominant extremity for all daily tasks.     Time  8    Period  Weeks    Status  On-going      OT LONG TERM GOAL #2   Title  Patient will increase her A/ROM of RUE to [facility] to increase ability to complete overhead reaching tasks with less difficulty.     Time  8    Period  Weeks    Status  On-going      OT LONG TERM GOAL #3   Title  Patient will increase RUE strength to 4/5 overall to increase ability to complete moderate weight lifting tasks with less difficulty.     Time  8    Period  Weeks    Status  On-going      OT LONG TERM GOAL #4   Title  Patient will decrease fascial restrictions to min amount or less in RUE to increase functional mobility needed to complete reaching tasks.     Time  8    Period  Weeks    Status  On-going      OT LONG TERM GOAL #5   Title  Patient will report a decreased pain level of 2/10 or less in RUE when completing daily tasks and when sleeping at night.     Time  8    Period  Weeks    Status  On-going            Plan - 10/27/17 1010    Clinical Impression Statement  A: Pt with min/mod fascial restrictions in upper arm, anterior deltoid, and upper trapezius today, manual therapy completed to address restrictions limiting ROM. Pt reports HEP is going well and she has been working out in the yard planting flowers this  weekend/week. Continued with A/ROM and scapular theraband, added ball on wall this session for scapular and shoulder stability. Only completed UBE in reverse to focus on scapular retraction as pt has minimal during exercises. Verbal cuing during session for form and technique.     Plan  P: Continue with manual therapy. work on improving scapular mobility during exercises, attempt sidelying A/ROM        Patient will benefit from skilled therapeutic intervention in order to improve the following deficits and impairments:  Decreased strength, Pain, Decreased range of motion, Increased fascial restrictions, Impaired UE functional use  Visit Diagnosis: Other symptoms and signs involving the musculoskeletal system  Chronic right shoulder pain  Stiffness of right shoulder, not elsewhere classified    Problem List Patient Active Problem List   Diagnosis Date Noted  . Urge incontinence of urine 07/25/2017  . 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 Hyperlipidemia 07/06/2017  . Chronic pain of both knees 07/06/2017  . Left ovarian cyst 07/06/2017   Guadelupe Sabin, OTR/L  [phone] 10/27/2017, 10:13 AM  [facility] [address] [place], [place], [address] Phone: [phone]   Fax:  [phone]  Name: HOLLYANNE SCHLOESSER MRN: [id number] Date of Birth: August 13, 1965

## 2017-10-31 ENCOUNTER — Ambulatory Visit (HOSPITAL_COMMUNITY): Payer: No Typology Code available for payment source | Admitting: Specialist

## 2017-10-31 ENCOUNTER — Encounter (HOSPITAL_COMMUNITY): Payer: Self-pay | Admitting: Specialist

## 2017-10-31 DIAGNOSIS — M25511 Pain in right shoulder: Secondary | ICD-10-CM

## 2017-10-31 DIAGNOSIS — R29898 Other symptoms and signs involving the musculoskeletal system: Secondary | ICD-10-CM

## 2017-10-31 DIAGNOSIS — G8929 Other chronic pain: Secondary | ICD-10-CM

## 2017-10-31 DIAGNOSIS — M25611 Stiffness of right shoulder, not elsewhere classified: Secondary | ICD-10-CM

## 2017-10-31 NOTE — Therapy (Signed)
[place], [place], [address] Phone: [phone]   Fax:  [phone]  Occupational Therapy Treatment  Patient Details  Name: Elizabeth Moore MRN: [id number] Date of Birth: 1965/06/30 Referring Provider: Arther Abbott, MD   Encounter Date: 10/31/2017  OT End of Session - 10/31/17 1033    Visit Number  7    Number of Visits  16    Date for OT Re-Evaluation  11/23/17    Authorization Type  100% [facility] thru 12/21/17    OT Start Time  0909    OT Stop Time  0948    OT Time Calculation (min)  39 min    Activity Tolerance  Patient tolerated treatment well    Behavior During Therapy  [facility] for tasks assessed/performed       Past Medical History:  Diagnosis Date  . Arthritis   . Cervical cancer [facility]) age 26  . GERD (gastroesophageal reflux disease)     History reviewed. No pertinent surgical history.  There were no vitals filed for this visit.  Subjective Assessment - 10/31/17 0926    Subjective   S:  Its a little stiff this morning, all the way up into my neck    Currently in Pain?  Yes    Pain Score  4     Pain Location  Shoulder    Pain Orientation  Right    Pain Descriptors / Indicators  Tightness    Pain Type  Acute pain         [facility] OT Assessment - 10/31/17 0001      Assessment   Medical Diagnosis  right partial tear of RTC      Precautions   Precautions  None    Precaution Comments  avoid heavy lifting               OT Treatments/Exercises (OP) - 10/31/17 0001      Exercises   Exercises  Shoulder      Shoulder Exercises: Supine   Protraction  PROM;5 reps    Horizontal ABduction  PROM;5 reps    External Rotation  PROM;5 reps    Internal Rotation  PROM;5 reps    Flexion  PROM;5 reps    ABduction  PROM;10 reps      Shoulder Exercises: Sidelying   External Rotation  AROM;12 reps    Internal Rotation  AROM;12 reps    Flexion  AROM;12 reps    ABduction  AROM;12  reps      Shoulder Exercises: Standing   Protraction  AROM;12 reps    Horizontal ABduction  AROM;12 reps    External Rotation  AROM;12 reps;Theraband;15 reps    Theraband Level (Shoulder External Rotation)  Level 2 (Red)    Internal Rotation  AROM;12 reps;Theraband;15 reps    Theraband Level (Shoulder Internal Rotation)  Level 2 (Red)    Flexion  AROM;12 reps    ABduction  AROM;12 reps      Shoulder Exercises: ROM/Strengthening   UBE (Upper Arm Bike)  Level 1 3' reverse    "W" Arms  12X    X to V Arms  12X    Proximal Shoulder Strengthening, Seated  12X    Ball on Wall  1' flexion 1' abduction      Manual Therapy   Manual Therapy  Myofascial release    Manual therapy comments  Myofascial release performed seperate from therapeutic exercise     Myofascial Release  myofascial release to right upper arm, trapeizus, and scapularis regions to decrease pain and fascial restrictions, and increase joint range of motion.                 OT Short Term Goals - 10/06/17 1634      OT SHORT TERM GOAL #1   Title  Patient will be educated and independent with HEP to faciliate progress in therapy and increase functional performance during tasks when using her RUE.     Time  4    Period  Weeks    Status  On-going      OT SHORT TERM GOAL #2   Title  Patient will increase P/ROM to [facility] to increase ability to complete UBD tasks with less difficulty.     Time  4    Period  Weeks    Status  On-going      OT SHORT TERM GOAL #3   Title  Patient will increase RUE strength overall 3+/5 to increase ability to complete lightweight household tasks at waist level with less difficulty.     Time  4    Period  Weeks    Status  On-going      OT SHORT TERM GOAL #4   Title  Patient will decrease fascial restrictions to moderate amount in her RUE to increase funtional mobilty needed for daily tasks involving reaching.    Time  4    Period  Weeks    Status  On-going      OT SHORT TERM GOAL #5    Title  Patient will report a decreased pain level of 3/10 in RUE with movement and use.     Time  4    Period  Weeks    Status  On-going        OT Long Term Goals - 10/06/17 1635      OT LONG TERM GOAL #1   Title  Patient will return to highest level of independence while using her RUE as her dominant extremity for all daily tasks.     Time  8    Period  Weeks    Status  On-going      OT LONG TERM GOAL #2   Title  Patient will increase her A/ROM of RUE to [facility] to increase ability to complete overhead reaching tasks with less difficulty.     Time  8    Period  Weeks    Status  On-going      OT LONG TERM GOAL #3   Title  Patient will increase RUE strength to 4/5 overall to increase ability to complete moderate weight lifting tasks with less difficulty.     Time  8    Period  Weeks    Status  On-going      OT LONG TERM GOAL #4   Title  Patient will decrease fascial restrictions to min amount or less in RUE to increase functional mobility needed to complete reaching tasks.     Time  8    Period  Weeks    Status  On-going      OT LONG TERM GOAL #5   Title  Patient will report a decreased pain level of 2/10 or less in RUE when completing daily tasks and when sleeping at night.     Time  8    Period  Weeks    Status  On-going            Plan -  10/31/17 1033    Clinical Impression Statement  A:  patient demonstrating full p/rom in supine.  added a/rom in sidelying this date, also added w arms and external and internal rotation with red theraband to increase scapular stability required for overhead reaching. discussed alternative techniques to hold her grandson in order to decrease tightness she is feeling in her right shoulder and neck region.     Plan  P:  Attempt strengthening in supine, increase to green theraband for scapular stability exercises.       Patient will benefit from skilled therapeutic intervention in order to improve the following deficits and impairments:   Decreased strength, Pain, Decreased range of motion, Increased fascial restrictions, Impaired UE functional use  Visit Diagnosis: Other symptoms and signs involving the musculoskeletal system  Chronic right shoulder pain  Stiffness of right shoulder, not elsewhere classified    Problem List Patient Active Problem List   Diagnosis Date Noted  . Urge incontinence of urine 07/25/2017  . 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 Hyperlipidemia 07/06/2017  . Chronic pain of both knees 07/06/2017  . Left ovarian cyst 07/06/2017    Vangie Bicker, [place], OTR/L [phone]  10/31/2017, 10:37 AM  [place] [address] [place], [place], [address] Phone: [phone]   Fax:  [phone]  Name: Elizabeth Moore MRN: [id number] Date of Birth: 1965/12/15

## 2017-11-02 ENCOUNTER — Telehealth (HOSPITAL_COMMUNITY): Payer: Self-pay

## 2017-11-02 ENCOUNTER — Ambulatory Visit (INDEPENDENT_AMBULATORY_CARE_PROVIDER_SITE_OTHER): Payer: Self-pay | Admitting: Obstetrics and Gynecology

## 2017-11-02 ENCOUNTER — Ambulatory Visit (HOSPITAL_COMMUNITY): Payer: No Typology Code available for payment source

## 2017-11-02 ENCOUNTER — Encounter (HOSPITAL_COMMUNITY): Payer: Self-pay

## 2017-11-02 ENCOUNTER — Other Ambulatory Visit: Payer: Self-pay

## 2017-11-02 ENCOUNTER — Encounter: Payer: Self-pay | Admitting: Obstetrics and Gynecology

## 2017-11-02 VITALS — BP 112/70 | HR 67 | Ht 65.0 in | Wt 214.0 lb

## 2017-11-02 DIAGNOSIS — M25611 Stiffness of right shoulder, not elsewhere classified: Secondary | ICD-10-CM

## 2017-11-02 DIAGNOSIS — R29898 Other symptoms and signs involving the musculoskeletal system: Secondary | ICD-10-CM

## 2017-11-02 DIAGNOSIS — G8929 Other chronic pain: Secondary | ICD-10-CM

## 2017-11-02 DIAGNOSIS — M25511 Pain in right shoulder: Secondary | ICD-10-CM

## 2017-11-02 DIAGNOSIS — N904 Leukoplakia of vulva: Secondary | ICD-10-CM

## 2017-11-02 DIAGNOSIS — N3941 Urge incontinence: Secondary | ICD-10-CM

## 2017-11-02 NOTE — Progress Notes (Signed)
Patient ID: [name], female   DOB: [birth year], 52 y.o.   MRN: [id number]   [facility] Visit  @DATE @            Patient name: Elizabeth Moore MRN [id number]  Date of birth: 1966-02-21  CC & HPI:  Elizabeth Moore is a 52 y.o. female presenting today for another follow-up of her vulvar dystrophy. Her symptoms have improved. She never got the Rx Detrol LA to help because she couldn't afford it. She had used the money for a plane  ticket. She adds that the free clinic is helping her find a discount card for her the Detrol prescription. She has lost about 5 pounds since her last visit. The patient denies fever, chills or any other symptoms or complaints at this time.   ROS:  ROS +vulvar dystrophy +weight loss -fever -chills All systems are negative except as noted in the HPI and PMH.   Pertinent History Reviewed:   Reviewed: Significant for cervical cancer Medical         Past Medical History:  Diagnosis Date  . Arthritis   . Cervical cancer [facility]) age 61  . GERD (gastroesophageal reflux disease)                               Surgical Hx:   History reviewed. No pertinent surgical history. Medications: Reviewed & Updated - see associated section                       Current Outpatient Medications:  .  cetirizine (ZYRTEC) 10 MG tablet, Take 10 mg by mouth daily., Disp: , Rfl:  .  cyclobenzaprine (FLEXERIL) 10 MG tablet, Take 1 tablet (10 mg total) by mouth 3 (three) times daily as needed for muscle spasms., Disp: 30 tablet, Rfl: 0 .  diclofenac (VOLTAREN) 75 MG EC tablet, Take 1 tablet (75 mg total) by mouth 2 (two) times daily as needed., Disp: 30 tablet, Rfl: 0 .  ibuprofen (ADVIL,MOTRIN) 200 MG tablet, Take 600 mg by mouth every 8 (eight) hours as needed., Disp: , Rfl:  .  fesoterodine (TOVIAZ) 4 MG TB24 tablet, Take 1 tablet (4 mg total) by mouth daily. (Patient not taking: Reported on 09/28/2017), Disp: 90 tablet, Rfl: 1 .  HYDROcodone-acetaminophen  (NORCO/VICODIN) 5-325 MG tablet, Take 1 tablet by mouth every 6 (six) hours as needed for moderate pain. (Patient not taking: Reported on 11/02/2017), Disp: 28 tablet, Rfl: 0   Social History: Reviewed -  reports that she has never smoked. She has never used smokeless tobacco.  Objective Findings:  Vitals: Blood pressure 112/70, pulse 67, height 5\' 5"  (1.651 m), weight 214 lb (97.1 kg).  PHYSICAL EXAMINATION General appearance - alert, well appearing, and in no distress, oriented to person, place, and time and overweight Mental status - alert, oriented to person, place, and time, normal mood, behavior, speech, dress, motor activity, and thought processes, affect appropriate to mood  PELVIC DEFERRED   Assessment & Plan:   A:  1. F/U vulvar dystrophy 2. Post menopausal cyst 3. Urge incontinence 4. She has not complied with Detrol LA trial, She chose to fly to [place] to be with family on short notice and not buy her medication  P:  1. Advised to try Rx Detrol LA 2. Referred back to [facility] at the [facility] 3. F/u at free clinic 6 months for scheduling of  pelvic u/s of the benign appearing pelvic cyst.  By signing my name below, I, [name], attest that this documentation has been prepared under the direction and in the presence of Jonnie Kind, MD. Electronically Signed: [name], Medical Scribe. 11/02/17. 1:45 PM.  I personally performed the services described in this documentation, which was SCRIBED in my presence. The recorded information has been reviewed and considered accurate. It has been edited as necessary during review. Jonnie Kind, MD

## 2017-11-02 NOTE — Therapy (Addendum)
[place], [place], [address] Phone: [phone]   Fax:  [phone]  Occupational Therapy Treatment  Patient Details  Name: Elizabeth Moore MRN: [id number] Date of Birth: 09/26/1965 Referring Provider: Arther Abbott, MD   Encounter Date: 11/02/2017  OT End of Session - 11/02/17 0914    Visit Number  8    Number of Visits  16    Date for OT Re-Evaluation  11/23/17    Authorization Type  100% [facility] thru 12/21/17    OT Start Time  0906    OT Stop Time  0946    OT Time Calculation (min)  40 min    Activity Tolerance  Patient tolerated treatment well    Behavior During Therapy  [facility] for tasks assessed/performed       Past Medical History:  Diagnosis Date  . Arthritis   . Cervical cancer [facility]) age 80  . GERD (gastroesophageal reflux disease)     History reviewed. No pertinent surgical history.  There were no vitals filed for this visit.  Subjective Assessment - 11/02/17 0909    Subjective   S:  It doesn't hurt unless I move it but if I'm lifting, it shoots up to my neck.    Currently in Pain?  No/denies pain of 4/10 with movement         [facility] OT Assessment - 11/02/17 0910      Assessment   Medical Diagnosis  right partial tear of RTC      Precautions   Precautions  None    Precaution Comments  avoid heavy lifting               OT Treatments/Exercises (OP) - 11/02/17 0910      Exercises   Exercises  Shoulder      Shoulder Exercises: Supine   Protraction  PROM;5 reps;Strengthening;10 reps    Protraction Weight (lbs)  1    Horizontal ABduction  PROM;5 reps;Strengthening;10 reps    Horizontal ABduction Weight (lbs)  1    External Rotation  PROM;5 reps;Strengthening;10 reps    External Rotation Weight (lbs)  1    Internal Rotation  PROM;5 reps;Strengthening;10 reps    Internal Rotation Weight (lbs)  1    Flexion  PROM;5 reps;Strengthening;10 reps    Shoulder Flexion  Weight (lbs)  1    ABduction  PROM;5 reps;Strengthening;10 reps    Shoulder ABduction Weight (lbs)  1      Shoulder Exercises: Standing   Protraction  Strengthening;10 reps    Protraction Weight (lbs)  1    Horizontal ABduction  Strengthening;10 reps    Horizontal ABduction Weight (lbs)  1    External Rotation  Strengthening;10 reps    External Rotation Weight (lbs)  1    Internal Rotation  Strengthening;10 reps    Internal Rotation Weight (lbs)  1    Flexion  Strengthening;10 reps    Shoulder Flexion Weight (lbs)  1    ABduction  Strengthening;10 reps    Shoulder ABduction Weight (lbs)  1      Shoulder Exercises: Therapy Ball   Flexion  Both;10 reps    ABduction  Both;10 reps    Other Therapy Ball Exercises  chest press; 10X      Shoulder Exercises: ROM/Strengthening   X to V Arms  10X; 1#    Proximal Shoulder Strengthening, Supine  10X; 1# no rest breaks    Proximal Shoulder Strengthening, Seated  10X; 1# no rest breaks    Ball on Wall  1' flexion 1' abduction      Manual Therapy   Manual Therapy  Myofascial release    Manual therapy comments  Myofascial release performed seperate from therapeutic exercise     Myofascial Release  myofascial release to right upper arm, trapeizus, and scapularis regions to decrease pain and fascial restrictions, and increase joint range of motion.               OT Education - 11/02/17 0954    Education provided  Yes    Education Details  Patient instructed to use green theraband for scapular theraband exercises as part of HEP.    Person(s) Educated  Patient    Methods  Explanation;Verbal cues    Comprehension  Verbalized understanding;Returned demonstration       OT Short Term Goals - 10/06/17 1634      OT SHORT TERM GOAL #1   Title  Patient will be educated and independent with HEP to faciliate progress in therapy and increase functional performance during tasks when using her RUE.     Time  4    Period  Weeks    Status   On-going      OT SHORT TERM GOAL #2   Title  Patient will increase P/ROM to [facility] to increase ability to complete UBD tasks with less difficulty.     Time  4    Period  Weeks    Status  On-going      OT SHORT TERM GOAL #3   Title  Patient will increase RUE strength overall 3+/5 to increase ability to complete lightweight household tasks at waist level with less difficulty.     Time  4    Period  Weeks    Status  On-going      OT SHORT TERM GOAL #4   Title  Patient will decrease fascial restrictions to moderate amount in her RUE to increase funtional mobilty needed for daily tasks involving reaching.    Time  4    Period  Weeks    Status  On-going      OT SHORT TERM GOAL #5   Title  Patient will report a decreased pain level of 3/10 in RUE with movement and use.     Time  4    Period  Weeks    Status  On-going        OT Long Term Goals - 10/06/17 1635      OT LONG TERM GOAL #1   Title  Patient will return to highest level of independence while using her RUE as her dominant extremity for all daily tasks.     Time  8    Period  Weeks    Status  On-going      OT LONG TERM GOAL #2   Title  Patient will increase her A/ROM of RUE to [facility] to increase ability to complete overhead reaching tasks with less difficulty.     Time  8    Period  Weeks    Status  On-going      OT LONG TERM GOAL #3   Title  Patient will increase RUE strength to 4/5 overall to increase ability to complete moderate weight lifting tasks with less difficulty.     Time  8    Period  Weeks    Status  On-going      OT LONG TERM GOAL #4  Title  Patient will decrease fascial restrictions to min amount or less in RUE to increase functional mobility needed to complete reaching tasks.     Time  8    Period  Weeks    Status  On-going      OT LONG TERM GOAL #5   Title  Patient will report a decreased pain level of 2/10 or less in RUE when completing daily tasks and when sleeping at night.     Time  8     Period  Weeks    Status  On-going            Plan - 11/02/17 0929    Clinical Impression Statement  A:  Patient presenting with increased tightness this session. Manual techniques and passive stretching done to address. Patient reports pain in neck and was encouraged to keep doing the neck stretches she was given last session. Patient demonstrated mild pain with flexion and abduction P/ROM but was able to reach full range by final repitition. Patient progressed to strengthening in supine and standing this session as well as progressed to green theraband for scapular theraband exercises. Therapy ball exercises added for flexion, abduction, and chest press. Patient tolerated strengthening well and did not require rest breaks during or between exercises. Minimal VC required for form and technique.     Plan  P: Continue with manual techniques to address fascial restrictions in upper trapezius region. Add additional therapy ball exercises and increase reps for strengthening exercises as tolerated. Instruct patient to begin strengthening as part of HEP.    Consulted and Agree with Plan of Care  Patient       Patient will benefit from skilled therapeutic intervention in order to improve the following deficits and impairments:  Decreased strength, Pain, Decreased range of motion, Increased fascial restrictions, Impaired UE functional use  Visit Diagnosis: Other symptoms and signs involving the musculoskeletal system  Chronic right shoulder pain  Stiffness of right shoulder, not elsewhere classified    Problem List Patient Active Problem List   Diagnosis Date Noted  . Urge incontinence of urine 07/25/2017  . 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 Hyperlipidemia 07/06/2017  . Chronic pain of both knees 07/06/2017  . Left ovarian cyst 07/06/2017    Roderic Palau, OT student 11/02/2017, 10:12 AM  [place], [place],  [address] Phone: [phone]   Fax:  [phone]  Name: Elizabeth Moore MRN: [id number] Date of Birth: 1966-01-19

## 2017-11-02 NOTE — Telephone Encounter (Signed)
ask for copy of [facility] letter, pt will bring it next visit. NF 11/02/17

## 2017-11-08 ENCOUNTER — Encounter (HOSPITAL_COMMUNITY): Payer: No Typology Code available for payment source

## 2017-11-09 ENCOUNTER — Encounter

## 2017-11-10 ENCOUNTER — Encounter (HOSPITAL_COMMUNITY): Payer: Self-pay

## 2017-11-10 ENCOUNTER — Other Ambulatory Visit: Payer: Self-pay

## 2017-11-10 ENCOUNTER — Ambulatory Visit (HOSPITAL_COMMUNITY): Payer: No Typology Code available for payment source

## 2017-11-10 DIAGNOSIS — M25611 Stiffness of right shoulder, not elsewhere classified: Secondary | ICD-10-CM

## 2017-11-10 DIAGNOSIS — M25511 Pain in right shoulder: Secondary | ICD-10-CM

## 2017-11-10 DIAGNOSIS — G8929 Other chronic pain: Secondary | ICD-10-CM

## 2017-11-10 DIAGNOSIS — R29898 Other symptoms and signs involving the musculoskeletal system: Secondary | ICD-10-CM

## 2017-11-10 NOTE — Therapy (Addendum)
[place], [place], [address] Phone: [phone]   Fax:  [phone]  Occupational Therapy Treatment  Patient Details  Name: Elizabeth Moore MRN: [id number] Date of Birth: Jan 25, 1966 Referring Provider: Arther Abbott, MD   Encounter Date: 11/10/2017  OT End of Session - 11/10/17 1008    Visit Number  9    Number of Visits  16    Date for OT Re-Evaluation  11/23/17    Authorization Type  100% [facility] thru 12/21/17    OT Start Time  0947    OT Stop Time  1030    OT Time Calculation (min)  43 min    Activity Tolerance  Patient tolerated treatment well    Behavior During Therapy  [facility] for tasks assessed/performed       Past Medical History:  Diagnosis Date  . Arthritis   . Cervical cancer [facility]) age 69  . GERD (gastroesophageal reflux disease)     History reviewed. No pertinent surgical history.  There were no vitals filed for this visit.  Subjective Assessment - 11/10/17 1014    Subjective   S:  My shoulder isn't bothering me today. My knee pain is blocking it out.    Currently in Pain?  No/denies         [facility] OT Assessment - 11/10/17 0947      Assessment   Medical Diagnosis  right partial tear of RTC      Precautions   Precautions  None    Precaution Comments  avoid heavy lifting               OT Treatments/Exercises (OP) - 11/10/17 0947      Exercises   Exercises  Shoulder      Shoulder Exercises: Supine   Protraction  PROM;5 reps;Strengthening;15 reps    Protraction Weight (lbs)  1    Horizontal ABduction  PROM;5 reps;Strengthening;15 reps    Horizontal ABduction Weight (lbs)  1    External Rotation  PROM;5 reps;Strengthening;15 reps    External Rotation Weight (lbs)  1    Internal Rotation  PROM;5 reps;Strengthening;15 reps    Internal Rotation Weight (lbs)  1    Flexion  PROM;5 reps;Strengthening;15 reps    Shoulder Flexion Weight (lbs)  1    ABduction  PROM;5  reps;Strengthening;15 reps    Shoulder ABduction Weight (lbs)  1      Shoulder Exercises: Standing   Protraction  Strengthening;12 reps    Protraction Weight (lbs)  1    Horizontal ABduction  Strengthening;12 reps    Horizontal ABduction Weight (lbs)  1    External Rotation  Strengthening;12 reps    External Rotation Weight (lbs)  1    Internal Rotation  Strengthening;12 reps    Internal Rotation Weight (lbs)  1    Flexion  Strengthening;12 reps    Shoulder Flexion Weight (lbs)  1    ABduction  Strengthening;12 reps    Shoulder ABduction Weight (lbs)  1      Shoulder Exercises: Therapy Ball   Flexion  Both;10 reps    Other Therapy Ball Exercises  chest press, horizontal abduction, diagonals both directions, circles both directions; 10X      Shoulder Exercises: ROM/Strengthening   UBE (Upper Arm Bike)  Level 1; 1' forward and 1' reverse    X to V Arms  12X; 1#    Proximal Shoulder Strengthening, Supine  15X; 1#    Proximal  Shoulder Strengthening, Seated  12X; 1 #    Ball on Wall  1\' 30"  flexion 1' abduction      Manual Therapy   Manual Therapy  Myofascial release    Manual therapy comments  Myofascial release performed seperate from therapeutic exercise     Myofascial Release  myofascial release to right upper arm, trapeizus, and scapularis regions to decrease pain and fascial restrictions, and increase joint range of motion.               OT Education - 11/10/17 1006    Education provided  Yes    Education Details  Patient incstructed to add weight to shoulder exercises in HEP.    Person(s) Educated  Patient    Methods  Explanation;Demonstration;Verbal cues    Comprehension  Verbalized understanding;Returned demonstration       OT Short Term Goals - 10/06/17 1634      OT SHORT TERM GOAL #1   Title  Patient will be educated and independent with HEP to faciliate progress in therapy and increase functional performance during tasks when using her RUE.     Time  4     Period  Weeks    Status  On-going      OT SHORT TERM GOAL #2   Title  Patient will increase P/ROM to [facility] to increase ability to complete UBD tasks with less difficulty.     Time  4    Period  Weeks    Status  On-going      OT SHORT TERM GOAL #3   Title  Patient will increase RUE strength overall 3+/5 to increase ability to complete lightweight household tasks at waist level with less difficulty.     Time  4    Period  Weeks    Status  On-going      OT SHORT TERM GOAL #4   Title  Patient will decrease fascial restrictions to moderate amount in her RUE to increase funtional mobilty needed for daily tasks involving reaching.    Time  4    Period  Weeks    Status  On-going      OT SHORT TERM GOAL #5   Title  Patient will report a decreased pain level of 3/10 in RUE with movement and use.     Time  4    Period  Weeks    Status  On-going        OT Long Term Goals - 10/06/17 1635      OT LONG TERM GOAL #1   Title  Patient will return to highest level of independence while using her RUE as her dominant extremity for all daily tasks.     Time  8    Period  Weeks    Status  On-going      OT LONG TERM GOAL #2   Title  Patient will increase her A/ROM of RUE to [facility] to increase ability to complete overhead reaching tasks with less difficulty.     Time  8    Period  Weeks    Status  On-going      OT LONG TERM GOAL #3   Title  Patient will increase RUE strength to 4/5 overall to increase ability to complete moderate weight lifting tasks with less difficulty.     Time  8    Period  Weeks    Status  On-going      OT LONG TERM GOAL #4   Title  Patient will decrease fascial restrictions to min amount or less in RUE to increase functional mobility needed to complete reaching tasks.     Time  8    Period  Weeks    Status  On-going      OT LONG TERM GOAL #5   Title  Patient will report a decreased pain level of 2/10 or less in RUE when completing daily tasks and when sleeping at  night.     Time  8    Period  Weeks    Status  On-going            Plan - 11/10/17 1011    Clinical Impression Statement  A:  Patient reporting decreased pain this session. Trace fascial restrictions palpated in upper trapezius region. Patient reports she still maintains difficulty holding her grandchild for a prolonged period of time and lifting a heavy trash bags out of her trash can. Patient able to increase reps for supine and seated shoulder strengthening. Remaining therapy ball exercises added. Patient demonstrating improved form and endurance with no rest breaks required during or between exercises. Minimal VC required for form and technique.     Plan  P: Continue with manual techniques PRN. Increase weight to 2# for supine strengthening and add reps for seated strengthening. Add overhead lacing with wrist weight.   Consulted and Agree with Plan of Care  Patient       Patient will benefit from skilled therapeutic intervention in order to improve the following deficits and impairments:  Decreased strength, Pain, Decreased range of motion, Increased fascial restrictions, Impaired UE functional use  Visit Diagnosis: Other symptoms and signs involving the musculoskeletal system  Chronic right shoulder pain  Stiffness of right shoulder, not elsewhere classified    Problem List Patient Active Problem List   Diagnosis Date Noted  . Urge incontinence of urine 07/25/2017  . 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 Hyperlipidemia 07/06/2017  . Chronic pain of both knees 07/06/2017  . Left ovarian cyst 07/06/2017    Roderic Palau, OT student 11/10/2017, 10:29 AM  [place], [place], [address] Phone: [phone]   Fax:  [phone]  Name: Elizabeth Moore MRN: [id number] Date of Birth: 05-07-1966

## 2017-11-14 ENCOUNTER — Encounter (HOSPITAL_COMMUNITY): Payer: Self-pay

## 2017-11-14 ENCOUNTER — Encounter: Payer: Self-pay | Admitting: Orthopedic Surgery

## 2017-11-14 ENCOUNTER — Ambulatory Visit (HOSPITAL_COMMUNITY): Payer: No Typology Code available for payment source | Attending: Orthopedic Surgery

## 2017-11-14 ENCOUNTER — Ambulatory Visit (INDEPENDENT_AMBULATORY_CARE_PROVIDER_SITE_OTHER): Payer: Self-pay | Admitting: Orthopedic Surgery

## 2017-11-14 ENCOUNTER — Other Ambulatory Visit: Payer: Self-pay

## 2017-11-14 VITALS — BP 140/95 | HR 87 | Ht 65.0 in | Wt 213.0 lb

## 2017-11-14 DIAGNOSIS — M25611 Stiffness of right shoulder, not elsewhere classified: Secondary | ICD-10-CM | POA: Insufficient documentation

## 2017-11-14 DIAGNOSIS — M25511 Pain in right shoulder: Secondary | ICD-10-CM | POA: Insufficient documentation

## 2017-11-14 DIAGNOSIS — R29898 Other symptoms and signs involving the musculoskeletal system: Secondary | ICD-10-CM

## 2017-11-14 DIAGNOSIS — M75101 Unspecified rotator cuff tear or rupture of right shoulder, not specified as traumatic: Secondary | ICD-10-CM

## 2017-11-14 DIAGNOSIS — G8929 Other chronic pain: Secondary | ICD-10-CM | POA: Insufficient documentation

## 2017-11-14 NOTE — Patient Instructions (Signed)
Renew Flexeril for neck spasm  Perform exercises for neck and shoulder  Follow-up in 6 weeks

## 2017-11-14 NOTE — Therapy (Addendum)
[place], [place], [address] Phone: [phone]   Fax:  [phone]  Occupational Therapy Treatment  Patient Details  Name: Elizabeth Moore MRN: [id number] Date of Birth: [birth year] Referring Provider: Arther Abbott, MD   Encounter Date: 11/14/2017  OT End of Session - 11/14/17 1011    Visit Number  10    Number of Visits  16    Date for OT Re-Evaluation  11/23/17    Authorization Type  100% [facility] thru 12/21/17    OT Start Time  0950    OT Stop Time  1028    OT Time Calculation (min)  38 min    Activity Tolerance  Patient tolerated treatment well    Behavior During Therapy  [facility] for tasks assessed/performed       Past Medical History:  Diagnosis Date  . Arthritis   . Cervical cancer [facility]) age 26  . GERD (gastroesophageal reflux disease)     History reviewed. No pertinent surgical history.  There were no vitals filed for this visit.  Subjective Assessment - 11/14/17 0951    Subjective   S: The doctor says I should keep on coming until my next appointment with him.    Currently in Pain?  Yes    Pain Score  4     Pain Location  Shoulder    Pain Orientation  Right    Pain Descriptors / Indicators  Tightness    Pain Type  Acute pain    Pain Radiating Towards  neck    Pain Onset  Yesterday    Pain Frequency  Intermittent    Aggravating Factors   lifting items    Pain Relieving Factors  pain information    Effect of Pain on Daily Activities  minimal effect on ADLs    Multiple Pain Sites  No         [facility] OT Assessment - 11/14/17 0949      Assessment   Medical Diagnosis  right partial tear of RTC      Precautions   Precautions  None    Precaution Comments  avoid heavy lifting               OT Treatments/Exercises (OP) - 11/14/17 0954      Exercises   Exercises  Shoulder      Shoulder Exercises: Supine   Protraction  PROM;Strengthening;5 reps;10 reps    Protraction  Weight (lbs)  2    Horizontal ABduction  PROM;5 reps;Strengthening;10 reps    Horizontal ABduction Weight (lbs)  2    External Rotation  PROM;5 reps;Strengthening;10 reps    External Rotation Weight (lbs)  2    Internal Rotation  PROM;5 reps;Strengthening;10 reps    Internal Rotation Weight (lbs)  2    Flexion  PROM;5 reps;Strengthening;10 reps    Shoulder Flexion Weight (lbs)  2    ABduction  PROM;5 reps;Strengthening;10 reps    Shoulder ABduction Weight (lbs)  2      Shoulder Exercises: Standing   Protraction  Strengthening;15 reps    Protraction Weight (lbs)  1    Horizontal ABduction  Strengthening;15 reps    Horizontal ABduction Weight (lbs)  1    External Rotation  Strengthening;15 reps    External Rotation Weight (lbs)  1    Internal Rotation  Strengthening;15 reps    Internal Rotation Weight (lbs)  1    Flexion  Strengthening;15 reps    Shoulder  Flexion Weight (lbs)  1    ABduction  Strengthening;15 reps    Shoulder ABduction Weight (lbs)  1      Shoulder Exercises: ROM/Strengthening   Over Head Lace  1\' 30"  with 1# wrist weight    X to V Arms  15X; 1#    Proximal Shoulder Strengthening, Supine  10X; 2#    Proximal Shoulder Strengthening, Seated  15X; 1#    Ball on Wall  1' flexion and 1' abduction      Manual Therapy   Manual Therapy  Myofascial release    Manual therapy comments  Myofascial release performed seperate from therapeutic exercise     Myofascial Release  myofascial release to right upper arm, trapeizus, and scapularis regions to decrease pain and fascial restrictions, and increase joint range of motion.               OT Education - 11/14/17 1008    Education provided  No       OT Short Term Goals - 10/06/17 1634      OT SHORT TERM GOAL #1   Title  Patient will be educated and independent with HEP to faciliate progress in therapy and increase functional performance during tasks when using her RUE.     Time  4    Period  Weeks    Status   On-going      OT SHORT TERM GOAL #2   Title  Patient will increase P/ROM to [facility] to increase ability to complete UBD tasks with less difficulty.     Time  4    Period  Weeks    Status  On-going      OT SHORT TERM GOAL #3   Title  Patient will increase RUE strength overall 3+/5 to increase ability to complete lightweight household tasks at waist level with less difficulty.     Time  4    Period  Weeks    Status  On-going      OT SHORT TERM GOAL #4   Title  Patient will decrease fascial restrictions to moderate amount in her RUE to increase funtional mobilty needed for daily tasks involving reaching.    Time  4    Period  Weeks    Status  On-going      OT SHORT TERM GOAL #5   Title  Patient will report a decreased pain level of 3/10 in RUE with movement and use.     Time  4    Period  Weeks    Status  On-going        OT Long Term Goals - 10/06/17 1635      OT LONG TERM GOAL #1   Title  Patient will return to highest level of independence while using her RUE as her dominant extremity for all daily tasks.     Time  8    Period  Weeks    Status  On-going      OT LONG TERM GOAL #2   Title  Patient will increase her A/ROM of RUE to [facility] to increase ability to complete overhead reaching tasks with less difficulty.     Time  8    Period  Weeks    Status  On-going      OT LONG TERM GOAL #3   Title  Patient will increase RUE strength to 4/5 overall to increase ability to complete moderate weight lifting tasks with less difficulty.     Time  8    Period  Weeks    Status  On-going      OT LONG TERM GOAL #4   Title  Patient will decrease fascial restrictions to min amount or less in RUE to increase functional mobility needed to complete reaching tasks.     Time  8    Period  Weeks    Status  On-going      OT LONG TERM GOAL #5   Title  Patient will report a decreased pain level of 2/10 or less in RUE when completing daily tasks and when sleeping at night.     Time  8     Period  Weeks    Status  On-going            Plan - 11/14/17 1015    Clinical Impression Statement  A:  Patient reporting increased tightness in neck this session due to having to lift her grandson a lot this weekend. Minimal fascial restrictions palpated in upper trapezius region. Patient experiencing slight pain in her right side with flexion and abduction exercises this session. Patient able to increase weight for supine strengthening and increase reps for standing strengthening exercises. Overhead lacing with wrist weight added to work on endurance/strengthening. Patient performed well with strengthening with no rest breaks needed. Minimal VC for technique.    Plan  P: Continue with manual techniques PRN. Increase weight for standing strengthening and increase reps as tolerated. Increase time for overhead lacing with weight task.    Consulted and Agree with Plan of Care  Patient       Patient will benefit from skilled therapeutic intervention in order to improve the following deficits and impairments:  Decreased strength, Pain, Decreased range of motion, Increased fascial restrictions, Impaired UE functional use  Visit Diagnosis: Other symptoms and signs involving the musculoskeletal system  Chronic right shoulder pain  Stiffness of right shoulder, not elsewhere classified    Problem List Patient Active Problem List   Diagnosis Date Noted  . Urge incontinence of urine 07/25/2017  . 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 Hyperlipidemia 07/06/2017  . Chronic pain of both knees 07/06/2017  . Left ovarian cyst 07/06/2017    Roderic Palau, OT student 11/14/2017, 10:28 AM  [place], [place], [address] Phone: [phone]   Fax:  [phone]  Name: Elizabeth Moore MRN: [id number] Date of Birth: [birth year]

## 2017-11-14 NOTE — Progress Notes (Signed)
Progress Note   Patient ID: Elizabeth Moore, female   DOB: 1966/03/25, 52 y.o.   MRN: [id number]   Chief Complaint  Patient presents with  . Shoulder Pain    right     52 year old female free clinic referral had an MRI which showed a partial thickness anterior distal supraspinatus tendon tear without atrophy x-rays did not show arthritis  She went for physical therapy continued with diclofenac and Flexeril and says that her shoulder has improved her range of motion is better.  She still have some residual stiffness in the shoulder and some continued neck stiffness.  She would like her Flexeril refilled.    ROS   No Known Allergies   BP (!) 140/95   Pulse 87   Ht 5\' 5"  (1.651 m)   Wt 213 lb (96.6 kg)   BMI 35.45 kg/m   Physical Exam  Musculoskeletal:       Right shoulder: She exhibits decreased range of motion. She exhibits no bony tenderness, no swelling, no effusion, no crepitus, no deformity, no laceration, no spasm, normal pulse and normal strength.     Medical decisions:   Data  Imaging:   See hpi  Encounter Diagnosis  Name Primary?  Marland Kitchen Nontraumatic tear of right rotator cuff, unspecified tear extent Yes    PLAN:   Recommend continued exercises for the shoulder and neck  Follow-up in 6 weeks  Continue with Flexeril and diclofenac as needed    Arther Abbott, MD 11/14/2017 9:00 AM

## 2017-11-16 ENCOUNTER — Telehealth (HOSPITAL_COMMUNITY): Payer: Self-pay | Admitting: Physician Assistant

## 2017-11-16 ENCOUNTER — Ambulatory Visit (HOSPITAL_COMMUNITY): Payer: No Typology Code available for payment source

## 2017-11-16 NOTE — Telephone Encounter (Signed)
11/16/17  pt left a message to cx because she woke up with a sore throat this morning

## 2017-11-18 ENCOUNTER — Other Ambulatory Visit (HOSPITAL_COMMUNITY)
Admission: RE | Admit: 2017-11-18 | Discharge: 2017-11-18 | Disposition: A | Discharge status: Home / Self Care | Payer: No Typology Code available for payment source | Source: Ambulatory Visit | Attending: Physician Assistant | Admitting: Physician Assistant

## 2017-11-18 DIAGNOSIS — R030 Elevated blood-pressure reading, without diagnosis of hypertension: Secondary | ICD-10-CM | POA: Insufficient documentation

## 2017-11-18 DIAGNOSIS — E785 Hyperlipidemia, unspecified: Secondary | ICD-10-CM | POA: Insufficient documentation

## 2017-11-18 LAB — COMPREHENSIVE METABOLIC PANEL
ALBUMIN: 3.8 g/dL (ref 3.5–5.0)
ALK PHOS: 92 U/L (ref 38–126)
ALT: 20 U/L (ref 0–44)
AST: 18 U/L (ref 15–41)
Anion gap: 6 (ref 5–15)
BUN: 10 mg/dL (ref 6–20)
CALCIUM: 8.6 mg/dL — AB (ref 8.9–10.3)
CHLORIDE: 110 mmol/L (ref 98–111)
CO2: 25 mmol/L (ref 22–32)
CREATININE: 0.78 mg/dL (ref 0.44–1.00)
GFR calc non Af Amer: >60 mL/min (ref >60)
GLUCOSE: 101 mg/dL — AB (ref 70–99)
Potassium: 4.1 mmol/L (ref 3.5–5.1)
SODIUM: 141 mmol/L (ref 135–145)
Total Bilirubin: 0.7 mg/dL (ref 0.3–1.2)
Total Protein: 6.7 g/dL (ref 6.5–8.1)

## 2017-11-18 LAB — LIPID PANEL
Cholesterol: 199 mg/dL (ref 0–200)
HDL: 39 mg/dL — AB (ref >40)
LDL CALC: 118 mg/dL — AB (ref 0–99)
Total CHOL/HDL Ratio: 5.1 RATIO
Triglycerides: 210 mg/dL — ABNORMAL HIGH (ref <150)
VLDL: 42 mg/dL — ABNORMAL HIGH (ref 0–40)

## 2017-11-21 ENCOUNTER — Ambulatory Visit: Payer: Medicaid Other | Admitting: Physician Assistant

## 2017-11-21 ENCOUNTER — Ambulatory Visit (HOSPITAL_COMMUNITY)
Admission: RE | Admit: 2017-11-21 | Discharge: 2017-11-21 | Disposition: A | Discharge status: Home / Self Care | Payer: No Typology Code available for payment source | Source: Ambulatory Visit | Attending: Physician Assistant | Admitting: Physician Assistant

## 2017-11-21 ENCOUNTER — Encounter: Payer: Self-pay | Admitting: Physician Assistant

## 2017-11-21 VITALS — BP 134/91 | HR 74 | Temp 97.9°F | Ht 65.0 in | Wt 216.7 lb

## 2017-11-21 DIAGNOSIS — M25562 Pain in left knee: Secondary | ICD-10-CM

## 2017-11-21 DIAGNOSIS — I10 Essential (primary) hypertension: Secondary | ICD-10-CM

## 2017-11-21 DIAGNOSIS — M25552 Pain in left hip: Secondary | ICD-10-CM

## 2017-11-21 DIAGNOSIS — E785 Hyperlipidemia, unspecified: Secondary | ICD-10-CM

## 2017-11-21 DIAGNOSIS — G8929 Other chronic pain: Secondary | ICD-10-CM

## 2017-11-21 DIAGNOSIS — E669 Obesity, unspecified: Secondary | ICD-10-CM

## 2017-11-21 DIAGNOSIS — N83202 Unspecified ovarian cyst, left side: Secondary | ICD-10-CM

## 2017-11-21 DIAGNOSIS — N3941 Urge incontinence: Secondary | ICD-10-CM

## 2017-11-21 MED ORDER — LISINOPRIL 10 MG PO TABS: 10.0000 mg | ORAL_TABLET | Freq: Every day | ORAL | 1 refills | Status: DC

## 2017-11-21 NOTE — Progress Notes (Signed)
BP (!) 134/91 (BP Location: Right Arm, Patient Position: Sitting, Cuff Size: Normal)   Pulse 74   Temp 97.9 F (36.6 C)   Ht 5\' 5"  (1.651 m)   Wt 216 lb 11.2 oz (98.3 kg)   SpO2 97%   BMI 36.06 kg/m    Subjective:    Patient ID: Elizabeth Moore, female    DOB: 1966/01/28, 52 y.o.   MRN: [id number]  HPI: Elizabeth Moore is a 52 y.o. female presenting on 11/21/2017 for Hypertension; Hyperlipidemia; and Urinary Incontinence   HPI   Pt didn't get her [name] yet because she only last week brought in the paperwork to get approved for med[facility]ist.   Discussed meds with pt.  She is not taking IBU and diclofenac of the same days.  Pt complains of leg pain.  Mostly on Left but sometimes on the right.  Mostly in knee but sometimes up in hip and states foot swells.   Pt states unpleasant recent OV with dr Glo Herring.   Relevant past medical, surgical, family and social history reviewed and updated as indicated. Interim medical history since our last visit reviewed. Allergies and medications reviewed and updated.   Current Outpatient Medications:  .  cetirizine (ZYRTEC) 10 MG tablet, Take 10 mg by mouth daily., Disp: , Rfl:  .  cyclobenzaprine (FLEXERIL) 10 MG tablet, Take 1 tablet (10 mg total) by mouth 3 (three) times daily as needed for muscle spasms., Disp: 30 tablet, Rfl: 0 .  diclofenac (VOLTAREN) 75 MG EC tablet, Take 1 tablet (75 mg total) by mouth 2 (two) times daily as needed., Disp: 30 tablet, Rfl: 0 .  ibuprofen (ADVIL,MOTRIN) 200 MG tablet, Take 600 mg by mouth every 8 (eight) hours as needed., Disp: , Rfl:  .  fesoterodine (TOVIAZ) 4 MG TB24 tablet, Take 1 tablet (4 mg total) by mouth daily. (Patient not taking: Reported on 09/28/2017), Disp: 90 tablet, Rfl: 1 .  HYDROcodone-acetaminophen (NORCO/VICODIN) 5-325 MG tablet, Take 1 tablet by mouth every 6 (six) hours as needed for moderate pain. (Patient not taking: Reported on 11/02/2017), Disp: 28 tablet, Rfl: 0   Review of  Systems  Constitutional: Negative for appetite change, chills, diaphoresis, fatigue, fever and unexpected weight change.  HENT: Negative for congestion, dental problem, drooling, ear pain, facial swelling, hearing loss, mouth sores, sneezing, sore throat, trouble swallowing and voice change.   Eyes: Negative for pain, discharge, redness, itching and visual disturbance.  Respiratory: Negative for cough, choking, shortness of breath and wheezing.   Cardiovascular: Positive for leg swelling. Negative for chest pain and palpitations.  Gastrointestinal: Negative for abdominal pain, blood in stool, constipation, diarrhea and vomiting.  Endocrine: Negative for cold intolerance, heat intolerance and polydipsia.  Genitourinary: Negative for decreased urine volume, dysuria and hematuria.  Musculoskeletal: Positive for arthralgias, back pain and gait problem.  Skin: Negative for rash.  Allergic/Immunologic: Negative for environmental allergies.  Neurological: Negative for seizures, syncope, light-headedness and headaches.  Hematological: Negative for adenopathy.  Psychiatric/Behavioral: Negative for agitation, dysphoric mood and suicidal ideas. The patient is not nervous/anxious.     Per HPI unless specifically indicated above     Objective:    BP (!) 134/91 (BP Location: Right Arm, Patient Position: Sitting, Cuff Size: Normal)   Pulse 74   Temp 97.9 F (36.6 C)   Ht 5\' 5"  (1.651 m)   Wt 216 lb 11.2 oz (98.3 kg)   SpO2 97%   BMI 36.06 kg/m   Wt Readings from Last  3 Encounters:  11/21/17 216 lb 11.2 oz (98.3 kg)  11/14/17 213 lb (96.6 kg)  11/02/17 214 lb (97.1 kg)    Physical Exam  Constitutional: She is oriented to person, place, and time. She appears well-developed and well-nourished.  HENT:  Head: Normocephalic and atraumatic.  Neck: Neck supple.  Cardiovascular: Normal rate and regular rhythm.  Pulmonary/Chest: Effort normal and breath sounds normal.  Abdominal: Soft. Bowel sounds  are normal. She exhibits no mass. There is no hepatosplenomegaly. There is no tenderness.  Musculoskeletal: She exhibits no edema.       Left hip: She exhibits decreased range of motion.       Left knee: She exhibits normal range of motion, no erythema, normal alignment, no LCL laxity and no MCL laxity.       Left ankle: Normal.  L hip ROM caused increase in pain.  Ambulation was abnormal due to the pain L hip and knee.    Lymphadenopathy:    She has no cervical adenopathy.  Neurological: She is alert and oriented to person, place, and time.  Skin: Skin is warm and dry.  Psychiatric: She has a normal mood and affect. Her behavior is normal.  Vitals reviewed.   Results for orders placed or performed during the hospital encounter of 11/18/17  Lipid panel  Result Value Ref Range   Cholesterol 199 0 - 200 mg/dL   Triglycerides 210 (H) <150 mg/dL   HDL 39 (L) >40 mg/dL   Total CHOL/HDL Ratio 5.1 RATIO   VLDL 42 (H) 0 - 40 mg/dL   LDL Cholesterol 118 (H) 0 - 99 mg/dL  Comprehensive metabolic panel  Result Value Ref Range   Sodium 141 135 - 145 mmol/L   Potassium 4.1 3.5 - 5.1 mmol/L   Chloride 110 98 - 111 mmol/L   CO2 25 22 - 32 mmol/L   Glucose, Bld 101 (H) 70 - 99 mg/dL   BUN 10 6 - 20 mg/dL   Creatinine, Ser 0.78 0.44 - 1.00 mg/dL   Calcium 8.6 (L) 8.9 - 10.3 mg/dL   Total Protein 6.7 6.5 - 8.1 g/dL   Albumin 3.8 3.5 - 5.0 g/dL   AST 18 15 - 41 U/L   ALT 20 0 - 44 U/L   Alkaline Phosphatase 92 38 - 126 U/L   Total Bilirubin 0.7 0.3 - 1.2 mg/dL   GFR calc non Af Amer >60 >60 mL/min   GFR calc Af Amer >60 >60 mL/min   Anion gap 6 5 - 15      Assessment & Plan:   Encounter Diagnoses  Name Primary?  . Left hip pain Yes  . Chronic pain of left knee   . Essential hypertension   . Hyperlipidemia, unspecified hyperlipidemia type   . Left ovarian cyst   . Urge incontinence of urine   . Obesity, unspecified classification, unspecified obesity type, unspecified whether  serious comorbidity present     -Reviewed labs with pt -Add lisinopril 10mg  -Pt will continue lowfat diet -will order [place] to reevaluate cyst Left adnexa (on CT 06/22/17) -Pt [facility] until some time in august  -Get xray L hip and refer back to orthopedics for hip and knee pain- she has appt 8/16 for shoulder follow up -pt to follow up here 4 weeks to recheck bp.  RTO sooner prn

## 2017-11-22 ENCOUNTER — Ambulatory Visit (HOSPITAL_COMMUNITY): Payer: No Typology Code available for payment source

## 2017-11-22 ENCOUNTER — Other Ambulatory Visit: Payer: Self-pay

## 2017-11-22 ENCOUNTER — Encounter (HOSPITAL_COMMUNITY): Payer: Self-pay

## 2017-11-22 DIAGNOSIS — M25611 Stiffness of right shoulder, not elsewhere classified: Secondary | ICD-10-CM

## 2017-11-22 DIAGNOSIS — G8929 Other chronic pain: Secondary | ICD-10-CM

## 2017-11-22 DIAGNOSIS — R29898 Other symptoms and signs involving the musculoskeletal system: Secondary | ICD-10-CM

## 2017-11-22 DIAGNOSIS — M25511 Pain in right shoulder: Secondary | ICD-10-CM

## 2017-11-22 NOTE — Therapy (Addendum)
[place], [place], [address] Phone: [phone]   Fax:  [phone]  Occupational Therapy Treatment  Patient Details  Name: Elizabeth Moore MRN: [id number] Date of Birth: 1965-12-12 Referring Provider: Arther Abbott, MD   Encounter Date: 11/22/2017  OT End of Session - 11/22/17 1036    Visit Number  11    Number of Visits  16    Authorization Type  100% [facility] thru 12/21/17    OT Start Time  0818    OT Stop Time  0858    OT Time Calculation (min)  40 min    Activity Tolerance  Patient tolerated treatment well    Behavior During Therapy  [facility] for tasks assessed/performed       Past Medical History:  Diagnosis Date  . Arthritis   . Cervical cancer [facility]) age 25  . GERD (gastroesophageal reflux disease)     History reviewed. No pertinent surgical history.  There were no vitals filed for this visit.  Subjective Assessment - 11/22/17 0824    Subjective   S: I had to change a tire so my arm is a little sore. Jacking it up irritated it.    Currently in Pain?  Yes    Pain Score  4     Pain Location  Shoulder    Pain Orientation  Right    Pain Descriptors / Indicators  Tightness    Pain Type  Acute pain    Pain Radiating Towards  N/A    Pain Onset  More than a month ago    Aggravating Factors   lifting     Pain Relieving Factors  pain medication    Effect of Pain on Daily Activities  minimal effect on ADLs    Multiple Pain Sites  No         [facility] OT Assessment - 11/22/17 0816      Assessment   Medical Diagnosis  right partial tear of RTC      Precautions   Precautions  None    Precaution Comments  avoid heavy lifting      ROM / Strength   AROM / PROM / Strength  AROM;PROM;Strength      AROM   Overall AROM Comments  Assessed seated. IR/er adducted    AROM Assessment Site  Shoulder    Right/Left Shoulder  Right    Right Shoulder Flexion  142 Degrees previous: 90    Right Shoulder  ABduction  135 Degrees previous: 85    Right Shoulder Internal Rotation  90 Degrees same as previous    Right Shoulder External Rotation  65 Degrees previous: 61      PROM   Overall PROM Comments  Assessed supine. IR/er adducted.    PROM Assessment Site  Shoulder    Right/Left Shoulder  Right    Right Shoulder Flexion  147 Degrees previous: 115    Right Shoulder ABduction  178 Degrees previous: 83    Right Shoulder Internal Rotation  90 Degrees same as previous    Right Shoulder External Rotation  60 Degrees same as previous      Strength   Overall Strength Comments  Assessed seated. IR/er adducted    Strength Assessment Site  Shoulder    Right/Left Shoulder  Right    Right Shoulder Flexion  4+/5 previous: 3-/5    Right Shoulder ABduction  4+/5 previous: 3-/5    Right Shoulder Internal Rotation  5/5  same as previous    Right Shoulder External Rotation  4+/5 same as previous               OT Treatments/Exercises (OP) - 11/22/17 0817      Exercises   Exercises  Shoulder      Shoulder Exercises: Supine   Protraction  PROM;5 reps    Horizontal ABduction  PROM;5 reps    External Rotation  PROM;5 reps    Internal Rotation  PROM;5 reps    Flexion  PROM;5 reps    ABduction  PROM;5 reps      Shoulder Exercises: Standing   Horizontal ABduction  Theraband;5 reps    Theraband Level (Shoulder Horizontal ABduction)  Level 2 (Red)    Retraction  Theraband;5 reps    Theraband Level (Shoulder Retraction)  Level 2 (Red)    Diagonals  Theraband;5 reps    Theraband Level (Shoulder Diagonals)  Level 2 (Red)      Shoulder Exercises: Stretch   Internal Rotation Stretch  30 seconds towel stretch      Manual Therapy   Manual Therapy  Myofascial release    Manual therapy comments  Myofascial release performed seperate from therapeutic exercise     Myofascial Release  myofascial release to right upper arm, trapeizus, and scapularis regions to decrease pain and fascial restrictions,  and increase joint range of motion.               OT Education - 11/22/17 1034    Education provided  Yes    Education Details  Patient HEP updated to include IR towel stretch due to complaints of difficulty unhooking bra. HEP updated to include additional red theraband shoulder strengthening exercises for increased variety in HEP.     Person(s) Educated  Patient    Methods  Explanation;Demonstration;Verbal cues;Handout    Comprehension  Verbalized understanding;Returned demonstration       OT Short Term Goals - 11/22/17 0844      OT SHORT TERM GOAL #1   Title  Patient will be educated and independent with HEP to faciliate progress in therapy and increase functional performance during tasks when using her RUE.     Time  4    Period  Weeks    Status  Achieved      OT SHORT TERM GOAL #2   Title  Patient will increase P/ROM to [facility] to increase ability to complete UBD tasks with less difficulty.     Time  4    Period  Weeks    Status  Achieved      OT SHORT TERM GOAL #3   Title  Patient will increase RUE strength overall 3+/5 to increase ability to complete lightweight household tasks at waist level with less difficulty.     Time  4    Period  Weeks    Status  Achieved      OT SHORT TERM GOAL #4   Title  Patient will decrease fascial restrictions to moderate amount in her RUE to increase funtional mobilty needed for daily tasks involving reaching.    Time  4    Period  Weeks    Status  Achieved      OT SHORT TERM GOAL #5   Title  Patient will report a decreased pain level of 3/10 in RUE with movement and use.     Time  4    Period  Weeks    Status  Achieved  OT Long Term Goals - 11/22/17 0846      OT LONG TERM GOAL #1   Title  Patient will return to highest level of independence while using her RUE as her dominant extremity for all daily tasks.     Time  8    Period  Weeks    Status  Partially Met      OT LONG TERM GOAL #2   Title  Patient will increase  her A/ROM of RUE to [facility] to increase ability to complete overhead reaching tasks with less difficulty.     Time  8    Period  Weeks    Status  Achieved      OT LONG TERM GOAL #3   Title  Patient will increase RUE strength to 4/5 overall to increase ability to complete moderate weight lifting tasks with less difficulty.     Time  8    Period  Weeks    Status  Achieved      OT LONG TERM GOAL #4   Title  Patient will decrease fascial restrictions to min amount or less in RUE to increase functional mobility needed to complete reaching tasks.     Time  8    Period  Weeks    Status  Achieved      OT LONG TERM GOAL #5   Title  Patient will report a decreased pain level of 2/10 or less in RUE when completing daily tasks and when sleeping at night.     Time  8    Period  Weeks    Status  Not Met            Plan - 11/22/17 1042    Clinical Impression Statement  A:  Minimal fascial restrictions palpated in anterior arm. Reassessment performed. Patient is showing significant improvement in P/ROM, A/ROM, and strength since last measured. All ROM is WFL. Patient has met all short term goals and 3/5 of her long term goals. Long term pain goal not met due to patient reporting moderate pain and discomfort while sleeping and laying in bed. Patient reports she is not using her right arm at the highest level of independence due to fear or worsening rotator cuff tear. She reports she is able to complete all her daily tasks and is only limited when heavy lifting is involved. Patient reporting some difficulty with unfastening her bra. IR towel stretch performed and added to HEP to address. Additional shoulder strengthening red theraband exercises added to HEP to offer patient more variety. Patient reports she is comfortable continuing independently with HEP. Therapist is in agreement.    Plan  P: Patient to discharge from skilled OT services and continue with HEP independently.    Consulted and Agree with  Plan of Care  Patient       Patient will benefit from skilled therapeutic intervention in order to improve the following deficits and impairments:  Decreased strength, Pain, Decreased range of motion, Increased fascial restrictions, Impaired UE functional use  Visit Diagnosis: Other symptoms and signs involving the musculoskeletal system  Chronic right shoulder pain  Stiffness of right shoulder, not elsewhere classified    Problem List Patient Active Problem List   Diagnosis Date Noted  . Urge incontinence of urine 07/25/2017  . 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 Hyperlipidemia 07/06/2017  . Chronic pain of both knees 07/06/2017  . Left ovarian cyst 07/06/2017    Roderic Palau, OT student 11/22/2017, 10:48 AM  [facility]  [facility] [place], [place], [address] Phone: [phone]   Fax:  [phone]  Name: Elizabeth Moore MRN: [id number] Date of Birth: [birth year]   OCCUPATIONAL THERAPY DISCHARGE SUMMARY  Visits from Start of Care: 11  Current functional level related to goals / functional outcomes: See above   Remaining deficits: See above   Education / Equipment: See above Plan: Patient agrees to discharge.  Patient goals were met. Patient is being discharged due to meeting the stated rehab goals.  ?????         Ailene Ravel, OTR/L,CBIS  [phone]

## 2017-11-22 NOTE — Patient Instructions (Signed)
Complete the following exercises 2-3 times a day. Internal rotation across back.  Grab the end of a towel with your affected side, palm facing backwards. Grab the towel with your unaffected side and pull your affected hand across your back until you feel a stretch in the front of your shoulder. If you feel pain, pull just to the pain, do not pull through the pain. Hold. Return your affected arm to your side. Try to keep your hand/arm close to your body during the entire movement.     Hold for 10-15 seconds. Complete 2 times.        Strengthening: Chest Pull - Resisted   Hold Theraband in front of body with hands about shoulder width a part. Pull band a part and back together slowly. Repeat ____ times. Complete ____ set(s) per session.. Repeat ____ session(s) per day.  http://orth.exer.us/926   Copyright  VHI. All rights reserved.   PNF Strengthening: Resisted   Standing with resistive band around each hand, bring right arm up and away, thumb back. Repeat ____ times per set. Do ____ sets per session. Do ____ sessions per day.                           Resisted External Rotation: in Neutral - Bilateral   Sit or stand, tubing in both hands, elbows at sides, bent to 90, forearms forward. Pinch shoulder blades together and rotate forearms out. Keep elbows at sides. Repeat ____ times per set. Do ____ sets per session. Do ____ sessions per day.  http://orth.exer.us/966   Copyright  VHI. All rights reserved.   PNF Strengthening: Resisted   Standing, hold resistive band above head. Bring right arm down and out from side. Repeat ____ times per set. Do ____ sets per session. Do ____ sessions per day.  http://orth.exer.us/922   Copyright  VHI. All rights reserved.

## 2017-11-23 ENCOUNTER — Telehealth: Payer: Self-pay | Admitting: Student

## 2017-11-23 NOTE — Telephone Encounter (Signed)
Called and notified pt of [place] appointment on Friday 11-25-17 at 11:30 am at [facility]. Pt verbalized understanding.

## 2017-11-24 ENCOUNTER — Encounter (HOSPITAL_COMMUNITY): Payer: No Typology Code available for payment source

## 2017-11-25 ENCOUNTER — Ambulatory Visit (HOSPITAL_COMMUNITY)
Admission: RE | Admit: 2017-11-25 | Discharge: 2017-11-25 | Disposition: A | Discharge status: Home / Self Care | Payer: Self-pay | Source: Ambulatory Visit | Attending: Physician Assistant | Admitting: Physician Assistant

## 2017-11-25 DIAGNOSIS — N83202 Unspecified ovarian cyst, left side: Secondary | ICD-10-CM | POA: Insufficient documentation

## 2017-11-29 ENCOUNTER — Encounter (HOSPITAL_COMMUNITY): Payer: No Typology Code available for payment source

## 2017-12-22 ENCOUNTER — Ambulatory Visit: Payer: Medicaid Other | Admitting: Physician Assistant

## 2017-12-22 ENCOUNTER — Encounter: Payer: Self-pay | Admitting: Physician Assistant

## 2017-12-22 VITALS — BP 128/86 | HR 70

## 2017-12-22 DIAGNOSIS — N3941 Urge incontinence: Secondary | ICD-10-CM

## 2017-12-22 DIAGNOSIS — I10 Essential (primary) hypertension: Secondary | ICD-10-CM

## 2017-12-22 DIAGNOSIS — M545 Low back pain: Secondary | ICD-10-CM

## 2017-12-22 DIAGNOSIS — M25552 Pain in left hip: Secondary | ICD-10-CM

## 2017-12-22 MED ORDER — PREDNISONE 10 MG PO TABS: ORAL_TABLET | ORAL | 0 refills | Status: DC

## 2017-12-22 NOTE — Progress Notes (Signed)
BP 128/86   Pulse 70    Subjective:    Patient ID: Elizabeth Moore, female    DOB: Dec 13, 1965, 52 y.o.   MRN: [id number]  HPI: Elizabeth Moore is a 52 y.o. female presenting on 12/22/2017 for Hypertension   HPI   Chief Complaint  Patient presents with  . Hypertension     Pt took the [place] for 4 days- it gave her flank pain and made it difficult for her to go to the bathroom so she stopped it and a few days later she was back to normal  She is having a difficult time getting around due to the L hip pain.  Referral to orthopedics was made for this last month.  Relevant past medical, surgical, family and social history reviewed and updated as indicated. Interim medical history since our last visit reviewed. Allergies and medications reviewed and updated.   Current Outpatient Medications:  .  cetirizine (ZYRTEC) 10 MG tablet, Take 10 mg by mouth daily., Disp: , Rfl:  .  cyclobenzaprine (FLEXERIL) 10 MG tablet, Take 1 tablet (10 mg total) by mouth 3 (three) times daily as needed for muscle spasms., Disp: 30 tablet, Rfl: 0 .  diclofenac (VOLTAREN) 75 MG EC tablet, Take 1 tablet (75 mg total) by mouth 2 (two) times daily as needed., Disp: 30 tablet, Rfl: 0 .  ibuprofen (ADVIL,MOTRIN) 200 MG tablet, Take 600 mg by mouth every 8 (eight) hours as needed., Disp: , Rfl:  .  lisinopril (PRINIVIL,ZESTRIL) 10 MG tablet, Take 1 tablet (10 mg total) by mouth daily., Disp: 30 tablet, Rfl: 1   Review of Systems  Constitutional: Negative for appetite change, chills, diaphoresis, fatigue, fever and unexpected weight change.  HENT: Negative for congestion, dental problem, drooling, ear pain, facial swelling, hearing loss, mouth sores, sneezing, sore throat, trouble swallowing and voice change.   Eyes: Negative for pain, discharge, redness, itching and visual disturbance.  Respiratory: Negative for cough, choking, shortness of breath and wheezing.   Cardiovascular: Negative for chest pain,  palpitations and leg swelling.  Gastrointestinal: Negative for abdominal pain, blood in stool, constipation, diarrhea and vomiting.  Endocrine: Negative for cold intolerance, heat intolerance and polydipsia.  Genitourinary: Negative for decreased urine volume, dysuria and hematuria.  Musculoskeletal: Positive for arthralgias, back pain and gait problem.  Skin: Negative for rash.  Allergic/Immunologic: Negative for environmental allergies.  Neurological: Negative for seizures, syncope, light-headedness and headaches.  Hematological: Negative for adenopathy.  Psychiatric/Behavioral: Negative for agitation, dysphoric mood and suicidal ideas. The patient is not nervous/anxious.     Per HPI unless specifically indicated above     Objective:    BP 128/86   Pulse 70   Wt Readings from Last 3 Encounters:  11/21/17 216 lb 11.2 oz (98.3 kg)  11/14/17 213 lb (96.6 kg)  11/02/17 214 lb (97.1 kg)    Physical Exam  Constitutional: She is oriented to person, place, and time. She appears well-developed and well-nourished.  HENT:  Head: Normocephalic and atraumatic.  Neck: Neck supple.  Cardiovascular: Normal rate and regular rhythm.  Pulmonary/Chest: Effort normal and breath sounds normal.  Abdominal: Soft. Bowel sounds are normal. She exhibits no mass. There is no hepatosplenomegaly. There is no tenderness.  Musculoskeletal: She exhibits no edema.       Left hip: She exhibits decreased range of motion and tenderness.       Lumbar back: She exhibits decreased range of motion and tenderness.  Lymphadenopathy:    She has no  cervical adenopathy.  Neurological: She is alert and oriented to person, place, and time.  Skin: Skin is warm and dry.  Psychiatric: She has a normal mood and affect. Her behavior is normal.  Vitals reviewed.       Assessment & Plan:   Encounter Diagnoses  Name Primary?  . Essential hypertension Yes  . Left hip pain   . Low back pain, unspecified back pain  laterality, unspecified chronicity, with sciatica presence unspecified   . Urge incontinence of urine     -Reviewed results hip xray and pelvic US with pt -Pt counseled to bring all meds with her to every appointment -pt encouraged to Return to financial counselor as [place] care expired yesterday -discussed treatment now with prednisone versus awaiting orthopedics and she would like to proceed with prednisone -pt counseled to continue lisinopril for BP -she has appointment for a screening mammogram -pt to follow up 2 months.  RTO sooner prn

## 2017-12-30 ENCOUNTER — Ambulatory Visit (INDEPENDENT_AMBULATORY_CARE_PROVIDER_SITE_OTHER): Payer: Self-pay | Admitting: Orthopedic Surgery

## 2017-12-30 ENCOUNTER — Encounter: Payer: Self-pay | Admitting: Orthopedic Surgery

## 2017-12-30 VITALS — BP 120/68 | HR 75 | Ht 65.0 in | Wt 215.0 lb

## 2017-12-30 DIAGNOSIS — M75101 Unspecified rotator cuff tear or rupture of right shoulder, not specified as traumatic: Secondary | ICD-10-CM

## 2017-12-30 NOTE — Progress Notes (Signed)
Chief Complaint  Patient presents with  . Shoulder Pain    right     BP 120/68   Pulse 75   Ht 5\' 5"  (1.651 m)   Wt 215 lb (97.5 kg)   BMI 35.78 kg/m    52 years old had an MRI showed a partial-thickness tear bursal side supraspinatus tendon she underwent physical therapy started NSAIDs diclofenac and also took some Flexeril for some neck symptoms.  Her right shoulder she reports is better is having more symptoms in her cervical spine.  She received a Sterapred Dosepak which she has not been able to refill because of financial reasons.  Review of systems  Neck pain  Exam  Forward elevation active 150 degrees seems to have a slow forward elevation but says it only hurts minimally.  No significant weakness in the rotator cuff  Imaging/MRI  IMPRESSION: 1. Small shallow bursal surface partial thickness tear of the anterior aspect of the distal supraspinatus tendon with overlying mild subacromial/subdeltoid bursitis. 2. Otherwise, essentially normal exam.     Electronically Signed   By: Lorriane Shire M.D.   On: 07/25/2017 13:51   Recommend follow-up as needed but she should continue the diclofenac and the Flexeril.  If the Sterapred Dosepak does not improve the cervical spine then when she gets approved for the [facility] she can be worked up for that as well

## 2017-12-30 NOTE — Patient Instructions (Signed)
Continue diclofenac and Flexeril  Start the prednisone when you can to see if it clears up your neck pain

## 2018-02-21 ENCOUNTER — Encounter: Payer: Self-pay | Admitting: Physician Assistant

## 2018-02-21 ENCOUNTER — Ambulatory Visit: Payer: Medicaid Other | Admitting: Physician Assistant

## 2018-02-21 VITALS — BP 156/90 | HR 77 | Temp 97.7°F | Ht 65.0 in | Wt 217.5 lb

## 2018-02-21 DIAGNOSIS — M25512 Pain in left shoulder: Secondary | ICD-10-CM

## 2018-02-21 DIAGNOSIS — E785 Hyperlipidemia, unspecified: Secondary | ICD-10-CM

## 2018-02-21 DIAGNOSIS — Z9119 Patient's noncompliance with other medical treatment and regimen: Secondary | ICD-10-CM

## 2018-02-21 DIAGNOSIS — M545 Low back pain, unspecified: Secondary | ICD-10-CM

## 2018-02-21 DIAGNOSIS — Z91199 Patient's noncompliance with other medical treatment and regimen due to unspecified reason: Secondary | ICD-10-CM

## 2018-02-21 DIAGNOSIS — E669 Obesity, unspecified: Secondary | ICD-10-CM

## 2018-02-21 DIAGNOSIS — G8929 Other chronic pain: Secondary | ICD-10-CM

## 2018-02-21 DIAGNOSIS — M25522 Pain in left elbow: Secondary | ICD-10-CM

## 2018-02-21 DIAGNOSIS — I10 Essential (primary) hypertension: Secondary | ICD-10-CM

## 2018-02-21 MED ORDER — LISINOPRIL 10 MG PO TABS: 10.0000 mg | ORAL_TABLET | Freq: Every day | ORAL | 3 refills | Status: DC

## 2018-02-21 MED ORDER — PREDNISONE 10 MG PO TABS: ORAL_TABLET | ORAL | 0 refills | Status: DC

## 2018-02-21 NOTE — Progress Notes (Signed)
BP (!) 156/90 (BP Location: Right Arm, Patient Position: Sitting, Cuff Size: Large)   Pulse 77   Temp 97.7 F (36.5 C)   Ht 5\' 5"  (1.651 m)   Wt 217 lb 8 oz (98.7 kg)   SpO2 97%   BMI 36.19 kg/m    Subjective:    Patient ID: Elizabeth Moore, female    DOB: [birth year], 52 y.o.   MRN: [id number]  HPI: Elizabeth Moore is a 52 y.o. female presenting on 02/21/2018 for Hypertension   HPI   Pt is out of her bp meds  Pt c/o left arm pain in the elbow for about a week.   Pt complains of back pain when she lifts her legs Pt also c/o pain L shoulder Pt denies injury, just pains. She says that some of her other joint pains that she complained about at previous appointments has resolved.  Pt has appointment for screening mammogran on Thursday this week  Relevant past medical, surgical, family and social history reviewed and updated as indicated. Interim medical history since our last visit reviewed. Allergies and medications reviewed and updated.   Current Outpatient Medications:  .  cetirizine (ZYRTEC) 10 MG tablet, Take 10 mg by mouth daily., Disp: , Rfl:  .  cyclobenzaprine (FLEXERIL) 10 MG tablet, Take 1 tablet (10 mg total) by mouth 3 (three) times daily as needed for muscle spasms., Disp: 30 tablet, Rfl: 0 .  diclofenac (VOLTAREN) 75 MG EC tablet, Take 1 tablet (75 mg total) by mouth 2 (two) times daily as needed., Disp: 30 tablet, Rfl: 0 .  ibuprofen (ADVIL,MOTRIN) 200 MG tablet, Take 600 mg by mouth every 8 (eight) hours as needed., Disp: , Rfl:  .  lisinopril (PRINIVIL,ZESTRIL) 10 MG tablet, Take 1 tablet (10 mg total) by mouth daily. (Patient not taking: Reported on 02/21/2018), Disp: 30 tablet, Rfl: 1  Review of Systems  Constitutional: Negative for appetite change, chills, diaphoresis, fatigue, fever and unexpected weight change.  HENT: Negative for congestion, dental problem, drooling, ear pain, facial swelling, hearing loss, mouth sores, sneezing, sore throat,  trouble swallowing and voice change.   Eyes: Negative for pain, discharge, redness, itching and visual disturbance.  Respiratory: Negative for cough, choking, shortness of breath and wheezing.   Cardiovascular: Positive for leg swelling. Negative for chest pain and palpitations.  Gastrointestinal: Negative for abdominal pain, blood in stool, constipation, diarrhea and vomiting.  Endocrine: Negative for cold intolerance, heat intolerance and polydipsia.  Genitourinary: Negative for decreased urine volume, dysuria and hematuria.  Musculoskeletal: Positive for arthralgias, back pain and gait problem.  Skin: Negative for rash.  Allergic/Immunologic: Positive for environmental allergies.  Neurological: Negative for seizures, syncope, light-headedness and headaches.  Hematological: Negative for adenopathy.  Psychiatric/Behavioral: Negative for agitation, dysphoric mood and suicidal ideas. The patient is not nervous/anxious.     Per HPI unless specifically indicated above     Objective:    BP (!) 156/90 (BP Location: Right Arm, Patient Position: Sitting, Cuff Size: Large)   Pulse 77   Temp 97.7 F (36.5 C)   Ht 5\' 5"  (1.651 m)   Wt 217 lb 8 oz (98.7 kg)   SpO2 97%   BMI 36.19 kg/m   Wt Readings from Last 3 Encounters:  02/21/18 217 lb 8 oz (98.7 kg)  12/30/17 215 lb (97.5 kg)  11/21/17 216 lb 11.2 oz (98.3 kg)    Physical Exam  Constitutional: She is oriented to person, place, and time. She appears well-developed and  well-nourished.  HENT:  Head: Normocephalic and atraumatic.  Neck: Neck supple.  Cardiovascular: Normal rate and regular rhythm.  Pulmonary/Chest: Effort normal and breath sounds normal.  Abdominal: Soft. Bowel sounds are normal. She exhibits no mass. There is no hepatosplenomegaly. There is no tenderness.  Musculoskeletal: She exhibits no edema.       Left elbow: She exhibits normal range of motion, no swelling, no effusion and no deformity. Tenderness found.   Tenderness proximal ulna/olecrenon process.  Pain with supination.  FROM.   Lymphadenopathy:    She has no cervical adenopathy.  Neurological: She is alert and oriented to person, place, and time.  Skin: Skin is warm and dry.  Psychiatric: She has a normal mood and affect. Her behavior is normal.  Vitals reviewed.       Assessment & Plan:    Encounter Diagnoses  Name Primary?  . Essential hypertension Yes  . Left elbow pain   . Chronic low back pain without sciatica, unspecified back pain laterality   . Left shoulder pain, unspecified chronicity   . Hyperlipidemia, unspecified hyperlipidemia type   . Obesity, unspecified classification, unspecified obesity type, unspecified whether serious comorbidity present   . Personal history of noncompliance with medical treatment, presenting hazards to health     -pt to Resume lisinopril for blood pressure -rx prednisone taper for multiple joint complaints.  Recommended icing the joints that hurt 3-4 times daily for 10-20 minutes -pt to follow up in 3 months.  RTO sooner prn

## 2018-02-22 ENCOUNTER — Ambulatory Visit: Payer: Medicaid Other | Admitting: Physician Assistant

## 2018-02-23 ENCOUNTER — Ambulatory Visit (HOSPITAL_COMMUNITY): Payer: Self-pay

## 2018-03-03 ENCOUNTER — Other Ambulatory Visit (HOSPITAL_COMMUNITY): Payer: Self-pay | Admitting: *Deleted

## 2018-03-03 DIAGNOSIS — Z1231 Encounter for screening mammogram for malignant neoplasm of breast: Secondary | ICD-10-CM

## 2018-05-18 ENCOUNTER — Ambulatory Visit
Admission: RE | Admit: 2018-05-18 | Discharge: 2018-05-18 | Disposition: A | Discharge status: Home / Self Care | Payer: Self-pay | Source: Ambulatory Visit | Attending: Obstetrics and Gynecology | Admitting: Obstetrics and Gynecology

## 2018-05-18 ENCOUNTER — Encounter (HOSPITAL_COMMUNITY): Payer: Self-pay

## 2018-05-18 ENCOUNTER — Ambulatory Visit (HOSPITAL_COMMUNITY)
Admission: RE | Admit: 2018-05-18 | Discharge: 2018-05-18 | Disposition: A | Discharge status: Home / Self Care | Payer: Medicaid Other | Source: Ambulatory Visit | Attending: Obstetrics and Gynecology | Admitting: Obstetrics and Gynecology

## 2018-05-18 VITALS — BP 138/80 | Ht 65.0 in | Wt 218.0 lb

## 2018-05-18 DIAGNOSIS — Z1239 Encounter for other screening for malignant neoplasm of breast: Secondary | ICD-10-CM

## 2018-05-18 DIAGNOSIS — Z1231 Encounter for screening mammogram for malignant neoplasm of breast: Secondary | ICD-10-CM

## 2018-05-18 NOTE — Progress Notes (Signed)
No complaints today.   Pap Smear: Pap smear not completed today. Last Pap smear was in May 2019 at [facility] and normal per patient. Per patient has a history of an abnormal Pap smear when she was 53 years old that she thinks a LEEP was completed for follow-up. Patient stated all Pap smears have been normal since LEEP and that she has had more than three normal Pap smears. No Pap smear results are in Epic.  Physical exam: Breasts Breasts symmetrical. No skin abnormalities bilateral breasts. No nipple retraction bilateral breasts. No nipple discharge bilateral breasts. No lymphadenopathy. No lumps palpated bilateral breasts. No complaints of pain or tenderness on exam. Referred patient to the [facility] for a screening mammogram. Appointment scheduled for Thursday, May 18, 2018 at 1540.        Pelvic/Bimanual No Pap smear completed today since last Pap smear in May 2019 per patient. Pap smear not indicated per [facility]CP guidelines.   Smoking History: Patient has never smoked.  Patient Navigation: Patient education provided. Access to services provided for patient through [facility] program.   Colorectal Cancer Screening: Per patient has never had a colonoscopy completed. No complaints today. FIT Test given to patient to complete and return to B[facility]CP.  Breast and Cervical Cancer Risk Assessment: Patient has no family history of breast cancer, known genetic mutations, or radiation treatment to the chest before age 39. Per patient has a history of cervical dysplasia. Patient has no history of being immunocompromised or DES exposure in-utero.  Risk Assessment    Risk Scores      05/18/2018   Last edited by: Loletta Parish, RN   5-year risk: 1.3 %   Lifetime risk: 10.8 %

## 2018-05-18 NOTE — Patient Instructions (Signed)
Explained breast self awareness with Elizabeth Moore. Patient did not need a Pap smear today due to last Pap smear was in May 2019 per patient. Let her know [facility] will cover Pap smears every 3 years unless has a history of abnormal Pap smears. Referred patient to the [facility] for a screening mammogram. Appointment scheduled for Thursday, May 18, 2018 at 1540. Patient aware of appointment and will be there. Let [facility] know [facility] will follow up with her within the next couple weeks with results of mammogram by letter or phone. Elizabeth Moore verbalized understanding.  Raetta Agostinelli, Arvil Chaco, RN 2:04 PM

## 2018-05-22 ENCOUNTER — Encounter (HOSPITAL_COMMUNITY): Payer: Self-pay | Admitting: *Deleted

## 2018-05-22 ENCOUNTER — Other Ambulatory Visit (HOSPITAL_COMMUNITY)
Admission: RE | Admit: 2018-05-22 | Discharge: 2018-05-22 | Disposition: A | Discharge status: Home / Self Care | Payer: PRIVATE HEALTH INSURANCE | Source: Ambulatory Visit | Attending: Physician Assistant | Admitting: Physician Assistant

## 2018-05-22 DIAGNOSIS — E785 Hyperlipidemia, unspecified: Secondary | ICD-10-CM | POA: Insufficient documentation

## 2018-05-22 DIAGNOSIS — I10 Essential (primary) hypertension: Secondary | ICD-10-CM

## 2018-05-22 LAB — COMPREHENSIVE METABOLIC PANEL
ALBUMIN: 4 g/dL (ref 3.5–5.0)
ALT: 20 U/L (ref 0–44)
ANION GAP: 8 (ref 5–15)
AST: 18 U/L (ref 15–41)
Alkaline Phosphatase: 81 U/L (ref 38–126)
BUN: 13 mg/dL (ref 6–20)
CO2: 25 mmol/L (ref 22–32)
Calcium: 8.9 mg/dL (ref 8.9–10.3)
Chloride: 105 mmol/L (ref 98–111)
Creatinine, Ser: 0.87 mg/dL (ref 0.44–1.00)
GFR calc Af Amer: >60 mL/min (ref >60)
GFR calc non Af Amer: >60 mL/min (ref >60)
GLUCOSE: 112 mg/dL — AB (ref 70–99)
Potassium: 4.4 mmol/L (ref 3.5–5.1)
Sodium: 138 mmol/L (ref 135–145)
Total Bilirubin: 0.6 mg/dL (ref 0.3–1.2)
Total Protein: 7.3 g/dL (ref 6.5–8.1)

## 2018-05-22 LAB — LIPID PANEL
CHOLESTEROL: 213 mg/dL — AB (ref 0–200)
HDL: 42 mg/dL (ref >40)
LDL Cholesterol: 135 mg/dL — ABNORMAL HIGH (ref 0–99)
Total CHOL/HDL Ratio: 5.1 RATIO
Triglycerides: 182 mg/dL — ABNORMAL HIGH (ref <150)
VLDL: 36 mg/dL (ref 0–40)

## 2018-05-24 ENCOUNTER — Ambulatory Visit: Payer: Medicaid Other | Admitting: Physician Assistant

## 2018-06-07 ENCOUNTER — Ambulatory Visit: Payer: Medicaid Other | Admitting: Physician Assistant

## 2018-06-07 ENCOUNTER — Encounter: Payer: Self-pay | Admitting: Physician Assistant

## 2018-06-07 VITALS — BP 119/81 | HR 75 | Temp 97.7°F | Ht 65.0 in | Wt 219.0 lb

## 2018-06-07 DIAGNOSIS — I10 Essential (primary) hypertension: Secondary | ICD-10-CM | POA: Insufficient documentation

## 2018-06-07 DIAGNOSIS — E785 Hyperlipidemia, unspecified: Secondary | ICD-10-CM

## 2018-06-07 DIAGNOSIS — E669 Obesity, unspecified: Secondary | ICD-10-CM

## 2018-06-07 MED ORDER — SIMVASTATIN 20 MG PO TABS: 20.0000 mg | ORAL_TABLET | Freq: Every day | ORAL | 4 refills | Status: DC

## 2018-06-07 MED ORDER — LISINOPRIL 10 MG PO TABS: 10.0000 mg | ORAL_TABLET | Freq: Every day | ORAL | 3 refills | Status: DC

## 2018-06-07 NOTE — Progress Notes (Signed)
BP 119/81 (BP Location: Right Arm, Patient Position: Sitting, Cuff Size: Normal)   Pulse 75   Temp 97.7 F (36.5 C)   Ht 5\' 5"  (1.651 m)   Wt 219 lb (99.3 kg)   SpO2 97%   BMI 36.44 kg/m    Subjective:    Patient ID: Elizabeth Moore, female    DOB: 11/30/1965, 53 y.o.   MRN: [id number]  HPI: Elizabeth Moore is a 53 y.o. female presenting on 06/07/2018 for Hypertension   HPI   Pt is doing well and has no complaints  Relevant past medical, surgical, family and social history reviewed and updated as indicated. Interim medical history since our last visit reviewed. Allergies and medications reviewed and updated.   Current Outpatient Medications:  .  cetirizine (ZYRTEC) 10 MG tablet, Take 10 mg by mouth daily., Disp: , Rfl:  .  cyclobenzaprine (FLEXERIL) 10 MG tablet, Take 1 tablet (10 mg total) by mouth 3 (three) times daily as needed for muscle spasms., Disp: 30 tablet, Rfl: 0 .  diclofenac (VOLTAREN) 75 MG EC tablet, Take 1 tablet (75 mg total) by mouth 2 (two) times daily as needed., Disp: 30 tablet, Rfl: 0 .  ibuprofen (ADVIL,MOTRIN) 200 MG tablet, Take 600 mg by mouth every 8 (eight) hours as needed., Disp: , Rfl:  .  lisinopril (PRINIVIL,ZESTRIL) 10 MG tablet, Take 1 tablet (10 mg total) by mouth daily., Disp: 30 tablet, Rfl: 3   Review of Systems  Constitutional: Negative for appetite change, chills, diaphoresis, fatigue, fever and unexpected weight change.  HENT: Positive for dental problem. Negative for congestion, drooling, ear pain, facial swelling, hearing loss, mouth sores, sneezing, sore throat, trouble swallowing and voice change.   Eyes: Negative for pain, discharge, redness, itching and visual disturbance.  Respiratory: Negative for cough, choking, shortness of breath and wheezing.   Cardiovascular: Negative for chest pain, palpitations and leg swelling.  Gastrointestinal: Negative for abdominal pain, blood in stool, constipation, diarrhea and vomiting.   Endocrine: Negative for cold intolerance, heat intolerance and polydipsia.  Genitourinary: Negative for decreased urine volume, dysuria and hematuria.  Musculoskeletal: Positive for arthralgias, back pain and gait problem.  Skin: Negative for rash.  Allergic/Immunologic: Negative for environmental allergies.  Neurological: Negative for seizures, syncope, light-headedness and headaches.  Hematological: Negative for adenopathy.  Psychiatric/Behavioral: Negative for agitation, dysphoric mood and suicidal ideas. The patient is not nervous/anxious.     Per HPI unless specifically indicated above     Objective:    BP 119/81 (BP Location: Right Arm, Patient Position: Sitting, Cuff Size: Normal)   Pulse 75   Temp 97.7 F (36.5 C)   Ht 5\' 5"  (1.651 m)   Wt 219 lb (99.3 kg)   SpO2 97%   BMI 36.44 kg/m   Wt Readings from Last 3 Encounters:  06/07/18 219 lb (99.3 kg)  05/18/18 218 lb (98.9 kg)  02/21/18 217 lb 8 oz (98.7 kg)    Physical Exam Vitals signs reviewed.  Constitutional:      Appearance: She is well-developed.  HENT:     Head: Normocephalic and atraumatic.  Neck:     Musculoskeletal: Neck supple.  Cardiovascular:     Rate and Rhythm: Normal rate and regular rhythm.  Pulmonary:     Effort: Pulmonary effort is normal.     Breath sounds: Normal breath sounds.  Abdominal:     General: Bowel sounds are normal.     Palpations: Abdomen is soft. There is no mass.  Tenderness: There is no abdominal tenderness.  Lymphadenopathy:     Cervical: No cervical adenopathy.  Skin:    General: Skin is warm and dry.  Neurological:     Mental Status: She is alert and oriented to person, place, and time.  Psychiatric:        Behavior: Behavior normal.     Results for orders placed or performed during the hospital encounter of 05/22/18  Lipid panel  Result Value Ref Range   Cholesterol 213 (H) 0 - 200 mg/dL   Triglycerides 182 (H) <150 mg/dL   HDL 42 >40 mg/dL   Total  CHOL/HDL Ratio 5.1 RATIO   VLDL 36 0 - 40 mg/dL   LDL Cholesterol 135 (H) 0 - 99 mg/dL  Comprehensive metabolic panel  Result Value Ref Range   Sodium 138 135 - 145 mmol/L   Potassium 4.4 3.5 - 5.1 mmol/L   Chloride 105 98 - 111 mmol/L   CO2 25 22 - 32 mmol/L   Glucose, Bld 112 (H) 70 - 99 mg/dL   BUN 13 6 - 20 mg/dL   Creatinine, Ser 0.87 0.44 - 1.00 mg/dL   Calcium 8.9 8.9 - 10.3 mg/dL   Total Protein 7.3 6.5 - 8.1 g/dL   Albumin 4.0 3.5 - 5.0 g/dL   AST 18 15 - 41 U/L   ALT 20 0 - 44 U/L   Alkaline Phosphatase 81 38 - 126 U/L   Total Bilirubin 0.6 0.3 - 1.2 mg/dL   GFR calc non Af Amer >60 >60 mL/min   GFR calc Af Amer >60 >60 mL/min   Anion gap 8 5 - 15      Assessment & Plan:    Encounter Diagnoses  Name Primary?  . Essential hypertension Yes  . Hyperlipidemia, unspecified hyperlipidemia type   . Obesity, unspecified classification, unspecified obesity type, unspecified whether serious comorbidity present     -Pt to call [facility] for PAP (she has [place] medicaid).  -Her mammogram is UTD -Her colon cancer screening is UTD -pt to continue lisinopril for her HTN -pt to start simvastatin for hyperlipidemia.  She is to follow lowfat diet and exercise regularly -pt to follow up in 3 months.  RTO sooner prn

## 2018-06-07 NOTE — Patient Instructions (Signed)

## 2018-09-06 ENCOUNTER — Encounter: Payer: Self-pay | Admitting: Physician Assistant

## 2018-09-06 ENCOUNTER — Ambulatory Visit: Payer: Medicaid Other | Admitting: Physician Assistant

## 2018-09-06 DIAGNOSIS — M545 Low back pain, unspecified: Secondary | ICD-10-CM

## 2018-09-06 DIAGNOSIS — I10 Essential (primary) hypertension: Secondary | ICD-10-CM

## 2018-09-06 DIAGNOSIS — G8929 Other chronic pain: Secondary | ICD-10-CM

## 2018-09-06 DIAGNOSIS — E785 Hyperlipidemia, unspecified: Secondary | ICD-10-CM

## 2018-09-06 MED ORDER — CYCLOBENZAPRINE HCL 10 MG PO TABS: 10.0000 mg | ORAL_TABLET | Freq: Three times a day (TID) | ORAL | 0 refills | Status: DC | PRN

## 2018-09-06 MED ORDER — LISINOPRIL 10 MG PO TABS: 10.0000 mg | ORAL_TABLET | Freq: Every day | ORAL | 4 refills | Status: DC

## 2018-09-06 NOTE — Progress Notes (Signed)
There were no vitals taken for this visit.   Subjective:    Patient ID: Elizabeth Moore, female    DOB: 1965-11-10, 53 y.o.   MRN: [id number]  HPI: Elizabeth Moore is a 53 y.o. female presenting on 09/06/2018 for No chief complaint on file.   HPI  This is a telemedicine visit through [employer] due to coronavirus pandemic  I connected with  Elizabeth Moore on 09/07/18 by a video enabled telemedicine application and verified that I am speaking with the correct person using two identifiers.   I discussed the limitations of evaluation and management by telemedicine. The patient expressed understanding and agreed to proceed.   Pt stopped the simvastatin after 3 nights- she says it made her dizzy  Pt is still having pain in lower back like she has had in the past.   She says she was going to see orthopedics about this but her [employer] financial assistance expired.  This was discussed at [place] 02/21/2018.  Pt is having no other problems.  She says she is following current guidelines by staying at home unless she needs to go to the store.  She says she is trying to stay active.  Relevant past medical, surgical, family and social history reviewed and updated as indicated. Interim medical history since our last visit reviewed. Allergies and medications reviewed and updated.    Current Outpatient Medications:  .  cetirizine (ZYRTEC) 10 MG tablet, Take 10 mg by mouth daily., Disp: , Rfl:  .  cyclobenzaprine (FLEXERIL) 10 MG tablet, Take 1 tablet (10 mg total) by mouth 3 (three) times daily as needed for muscle spasms., Disp: 30 tablet, Rfl: 0 .  diclofenac (VOLTAREN) 75 MG EC tablet, Take 1 tablet (75 mg total) by mouth 2 (two) times daily as needed., Disp: 30 tablet, Rfl: 0 .  ibuprofen (ADVIL,MOTRIN) 200 MG tablet, Take 600 mg by mouth every 8 (eight) hours as needed., Disp: , Rfl:  .  lisinopril (PRINIVIL,ZESTRIL) 10 MG tablet, Take 1 tablet (10 mg total) by mouth daily.,  Disp: 30 tablet, Rfl: 3 .  simvastatin (ZOCOR) 20 MG tablet, Take 1 tablet (20 mg total) by mouth at bedtime. (Patient not taking: Reported on 09/06/2018), Disp: 30 tablet, Rfl: 4     Review of Systems  Per HPI unless specifically indicated above     Objective:    There were no vitals taken for this visit.  Wt Readings from Last 3 Encounters:  06/07/18 219 lb (99.3 kg)  05/18/18 218 lb (98.9 kg)  02/21/18 217 lb 8 oz (98.7 kg)    Physical Exam Constitutional:      General: She is not in acute distress.    Appearance: She is not ill-appearing.  HENT:     Head: Normocephalic and atraumatic.  Pulmonary:     Effort: Pulmonary effort is normal. No respiratory distress.  Neurological:     Mental Status: She is alert and oriented to person, place, and time.  Psychiatric:        Mood and Affect: Mood normal.         Assessment & Plan:   Encounter Diagnoses  Name Primary?  . Essential hypertension Yes  . Hyperlipidemia, unspecified hyperlipidemia type   . Chronic low back pain without sciatica, unspecified back pain laterality      -will Mail [employer] application for pt to renew her financial assistance -Pt to continue lisinopril.   -Will discuss options for lipids at  next in-office visit.  Pt counseled to continue low fat diet -will renew flexeril.  Will refer bck to orthopedics when she gets financial assistance -pt to follow up in office in 3 months.  She is to contact office sooner prn

## 2018-11-27 ENCOUNTER — Other Ambulatory Visit (HOSPITAL_COMMUNITY)
Admission: RE | Admit: 2018-11-27 | Discharge: 2018-11-27 | Disposition: A | Discharge status: Home / Self Care | Payer: PRIVATE HEALTH INSURANCE | Source: Ambulatory Visit | Attending: Physician Assistant | Admitting: Physician Assistant

## 2018-11-27 DIAGNOSIS — E785 Hyperlipidemia, unspecified: Secondary | ICD-10-CM | POA: Insufficient documentation

## 2018-11-27 DIAGNOSIS — I10 Essential (primary) hypertension: Secondary | ICD-10-CM | POA: Insufficient documentation

## 2018-11-27 LAB — COMPREHENSIVE METABOLIC PANEL WITH GFR
ALT: 20 U/L (ref 0–44)
AST: 19 U/L (ref 15–41)
Albumin: 4.1 g/dL (ref 3.5–5.0)
Alkaline Phosphatase: 81 U/L (ref 38–126)
Anion gap: 9 (ref 5–15)
BUN: 10 mg/dL (ref 6–20)
CO2: 25 mmol/L (ref 22–32)
Calcium: 9.1 mg/dL (ref 8.9–10.3)
Chloride: 106 mmol/L (ref 98–111)
Creatinine, Ser: 0.9 mg/dL (ref 0.44–1.00)
GFR calc Af Amer: >60 mL/min
GFR calc non Af Amer: >60 mL/min
Glucose, Bld: 87 mg/dL (ref 70–99)
Potassium: 4.1 mmol/L (ref 3.5–5.1)
Sodium: 140 mmol/L (ref 135–145)
Total Bilirubin: 0.7 mg/dL (ref 0.3–1.2)
Total Protein: 7.2 g/dL (ref 6.5–8.1)

## 2018-11-27 LAB — LIPID PANEL
Cholesterol: 175 mg/dL (ref 0–200)
HDL: 40 mg/dL — ABNORMAL LOW
LDL Cholesterol: 109 mg/dL — ABNORMAL HIGH (ref 0–99)
Total CHOL/HDL Ratio: 4.4 ratio
Triglycerides: 131 mg/dL
VLDL: 26 mg/dL (ref 0–40)

## 2018-11-29 ENCOUNTER — Encounter: Payer: Self-pay | Admitting: Physician Assistant

## 2018-11-29 ENCOUNTER — Ambulatory Visit: Payer: Medicaid Other | Admitting: Physician Assistant

## 2018-11-29 DIAGNOSIS — G8929 Other chronic pain: Secondary | ICD-10-CM

## 2018-11-29 DIAGNOSIS — M545 Low back pain, unspecified: Secondary | ICD-10-CM

## 2018-11-29 DIAGNOSIS — I10 Essential (primary) hypertension: Secondary | ICD-10-CM

## 2018-11-29 DIAGNOSIS — E785 Hyperlipidemia, unspecified: Secondary | ICD-10-CM

## 2018-11-29 NOTE — Progress Notes (Signed)
There were no vitals taken for this visit.   Subjective:    Patient ID: Elizabeth Moore, female    DOB: [birth year], 53 y.o.   MRN: [id number]  HPI: Elizabeth Moore is a 53 y.o. female presenting on 11/29/2018 for No chief complaint on file.   HPI   This is a telemedicine appointment due to coronavirus pandemic.  It is via telephone because pt changed phone providers and cannot get her video to connect due to poor service.   I connected with  Elizabeth Moore on 11/29/18 by a video enabled telemedicine application and verified that I am speaking with the correct person using two identifiers.   I discussed the limitations of evaluation and management by telemedicine. The patient expressed understanding and agreed to proceed.  Pt is at home.  Provider is at office    She tried to renew her [employer] application.   She thinks it got thrown into the trash instead of put into the mailbox  She is good, she says.  She has no complaints today except for her back pain  Pt stopped her statin due to side effects    Relevant past medical, surgical, family and social history reviewed and updated as indicated. Interim medical history since our last visit reviewed. Allergies and medications reviewed and updated.   Current Outpatient Medications:  .  cetirizine (ZYRTEC) 10 MG tablet, Take 10 mg by mouth daily., Disp: , Rfl:  .  cyclobenzaprine (FLEXERIL) 10 MG tablet, Take 1 tablet (10 mg total) by mouth 3 (three) times daily as needed for muscle spasms., Disp: 30 tablet, Rfl: 0 .  diclofenac (VOLTAREN) 75 MG EC tablet, Take 1 tablet (75 mg total) by mouth 2 (two) times daily as needed., Disp: 30 tablet, Rfl: 0 .  ibuprofen (ADVIL,MOTRIN) 200 MG tablet, Take 600 mg by mouth every 8 (eight) hours as needed., Disp: , Rfl:  .  lisinopril (ZESTRIL) 10 MG tablet, Take 1 tablet (10 mg total) by mouth daily., Disp: 30 tablet, Rfl: 4 .  simvastatin (ZOCOR) 20 MG tablet, Take 1  tablet (20 mg total) by mouth at bedtime. (Patient not taking: Reported on 09/06/2018), Disp: 30 tablet, Rfl: 4   Review of Systems  Per HPI unless specifically indicated above     Objective:    There were no vitals taken for this visit.  Wt Readings from Last 3 Encounters:  06/07/18 219 lb (99.3 kg)  05/18/18 218 lb (98.9 kg)  02/21/18 217 lb 8 oz (98.7 kg)    Physical Exam Pulmonary:     Effort: No respiratory distress.  Neurological:     Mental Status: She is alert and oriented to person, place, and time.  Psychiatric:        Attention and Perception: Attention normal.        Speech: Speech normal.        Behavior: Behavior is cooperative.     Results for orders placed or performed during the hospital encounter of 11/27/18  Lipid panel  Result Value Ref Range   Cholesterol 175 0 - 200 mg/dL   Triglycerides 131 <150 mg/dL   HDL 40 (L) >40 mg/dL   Total CHOL/HDL Ratio 4.4 RATIO   VLDL 26 0 - 40 mg/dL   LDL Cholesterol 109 (H) 0 - 99 mg/dL  Comprehensive metabolic panel  Result Value Ref Range   Sodium 140 135 - 145 mmol/L   Potassium 4.1 3.5 - 5.1 mmol/L  Chloride 106 98 - 111 mmol/L   CO2 25 22 - 32 mmol/L   Glucose, Bld 87 70 - 99 mg/dL   BUN 10 6 - 20 mg/dL   Creatinine, Ser 0.90 0.44 - 1.00 mg/dL   Calcium 9.1 8.9 - 10.3 mg/dL   Total Protein 7.2 6.5 - 8.1 g/dL   Albumin 4.1 3.5 - 5.0 g/dL   AST 19 15 - 41 U/L   ALT 20 0 - 44 U/L   Alkaline Phosphatase 81 38 - 126 U/L   Total Bilirubin 0.7 0.3 - 1.2 mg/dL   GFR calc non Af Amer >60 >60 mL/min   GFR calc Af Amer >60 >60 mL/min   Anion gap 9 5 - 15      Assessment & Plan:    Encounter Diagnoses  Name Primary?  . Essential hypertension Yes  . Hyperlipidemia, unspecified hyperlipidemia type   . Chronic low back pain without sciatica, unspecified back pain laterality       -Pt doesn't want No statins due to side effects.  She will continue Lowfat diet -Will resend application for [employer]  [employer] to the pt -pt will return to orthopedist when her [employer] renews -pt encouraged to wear mask when in public to reduce risks of covid 19 transmission -pt to Follow up 3 months (no labs prior to that appointment).  She is to contact office sooner prn

## 2019-03-01 ENCOUNTER — Ambulatory Visit: Payer: Medicaid Other | Admitting: Physician Assistant

## 2019-04-18 ENCOUNTER — Other Ambulatory Visit: Payer: Self-pay

## 2019-04-18 DIAGNOSIS — Z20822 Contact with and (suspected) exposure to covid-19: Secondary | ICD-10-CM

## 2019-04-20 LAB — NOVEL CORONAVIRUS, NAA: SARS-CoV-2, NAA: NOT DETECTED

## 2019-04-25 ENCOUNTER — Telehealth: Payer: Self-pay

## 2019-04-25 NOTE — Telephone Encounter (Signed)
Patient called and was informed that her COVID-19 test 04/18/2019 was negative and she was not infected with the novel coronavirus.  She verbalized understanding.

## 2019-07-08 IMAGING — MR MR SHOULDER*R* W/O CM
4 of 5 series · 21 of 40 positions shown · non-contrast
Comparison: Radiographs dated 07/13/2017

CLINICAL DATA: Right shoulder pain for 3 months.

EXAM:
MRI OF THE RIGHT SHOULDER WITHOUT CONTRAST
TECHNIQUE: Multiplanar, multisequence MR imaging of the shoulder was performed.
No intravenous contrast was administered.

[Series 4: t2fs coronal · sagittal · 3.0mm · 0.29mm/px · 4 of 20 slices shown]
[im 1/20]
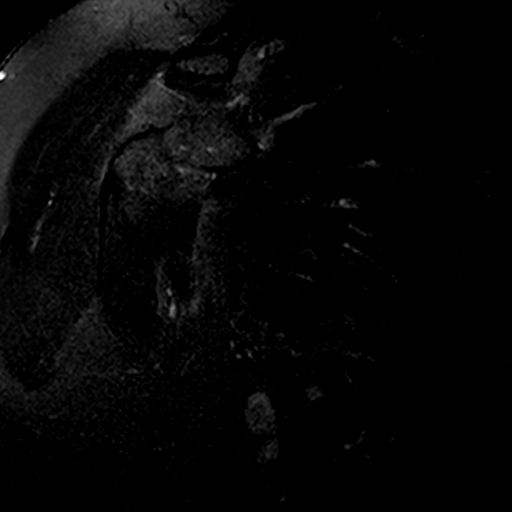
[im 4/20]
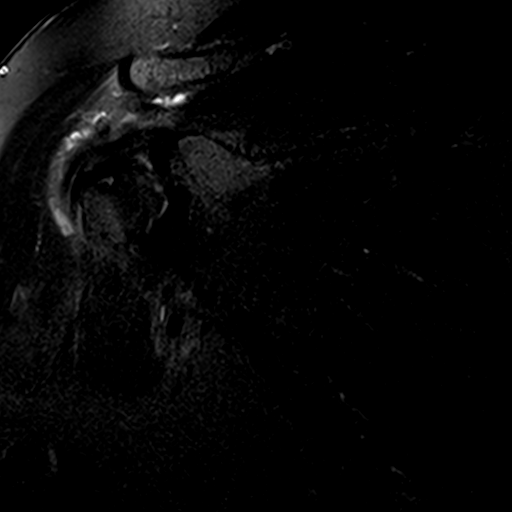
[im 12/20]
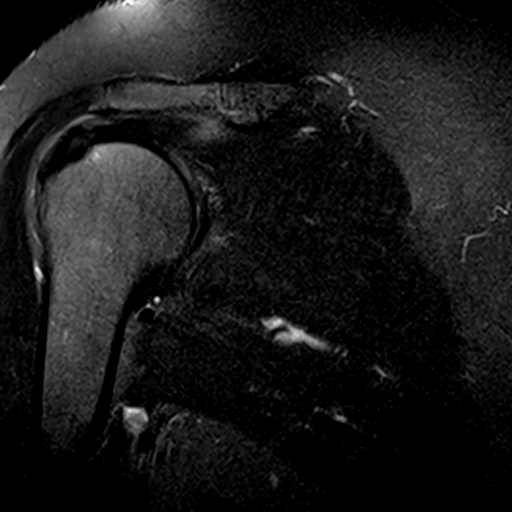
[im 20/20]
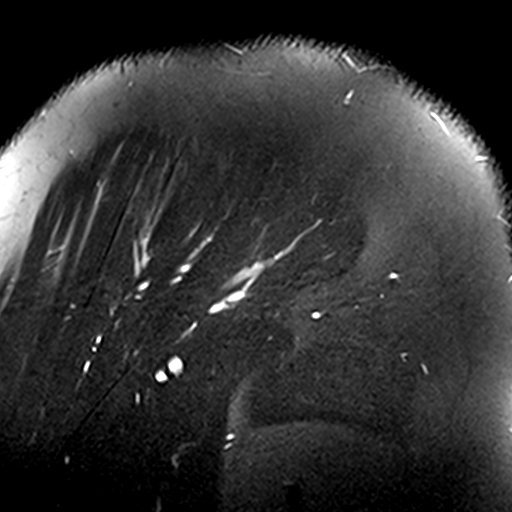

[Series 5: PD · sagittal · 3.0mm · 0.29mm/px · 6 of 20 slices shown]
[im 1/20]
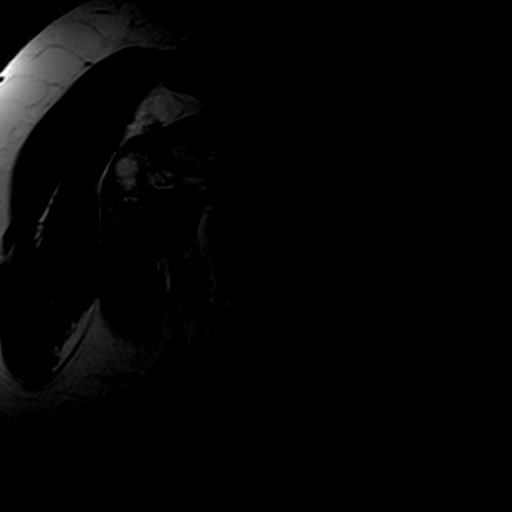
[im 4/20]
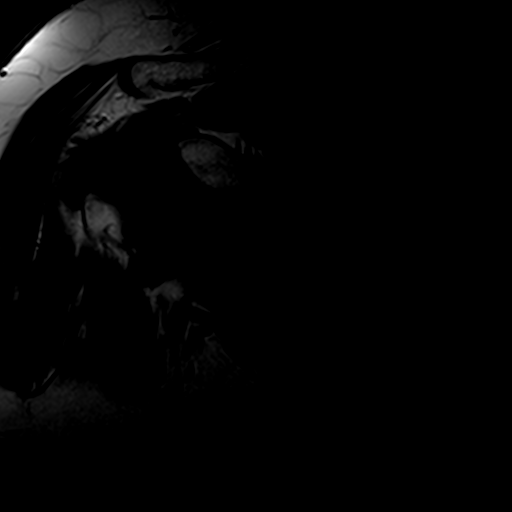
[im 8/20]
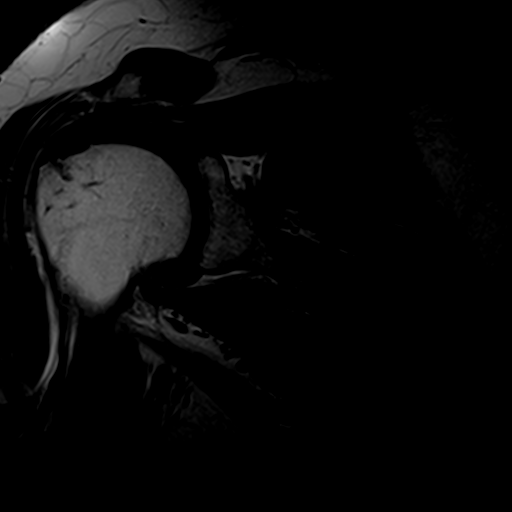
[im 12/20]
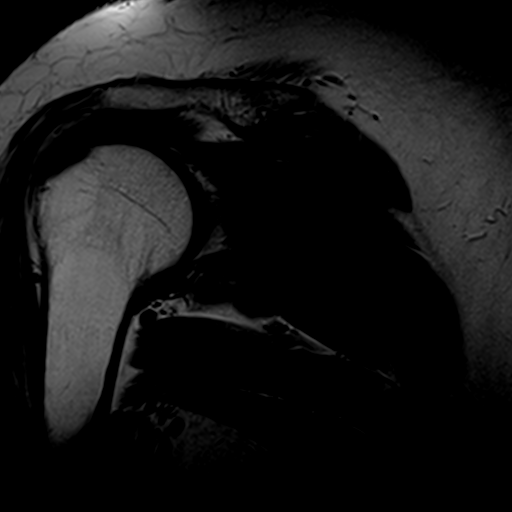
[im 16/20]
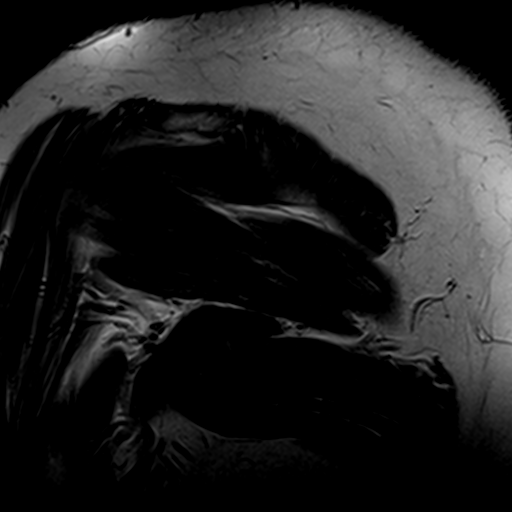
[im 20/20]
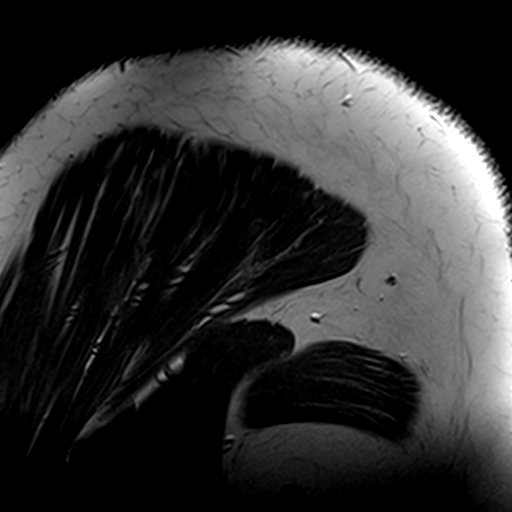

[Series 6: T1 · oblique · 3.0mm · 0.27mm/px · 8 of 48 slices shown]
[im 1/48]
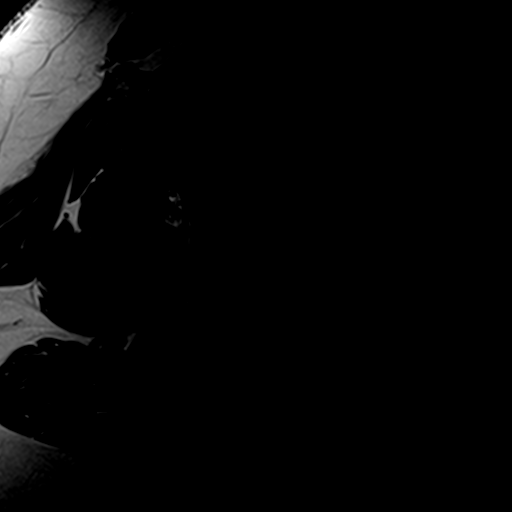
[im 8/48]
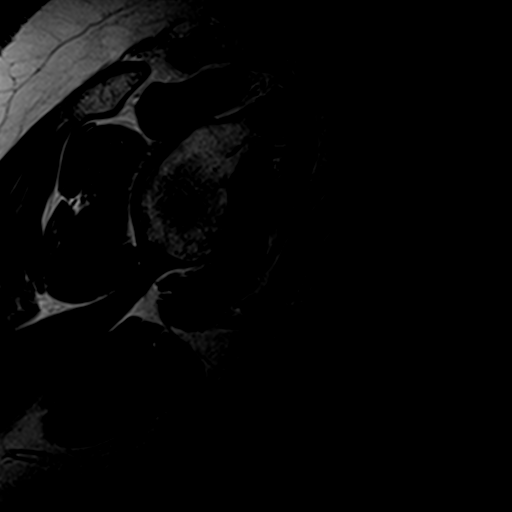
[im 15/48]
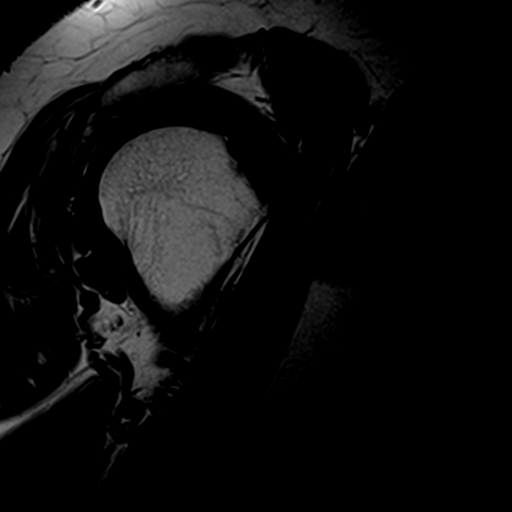
[im 22/48]
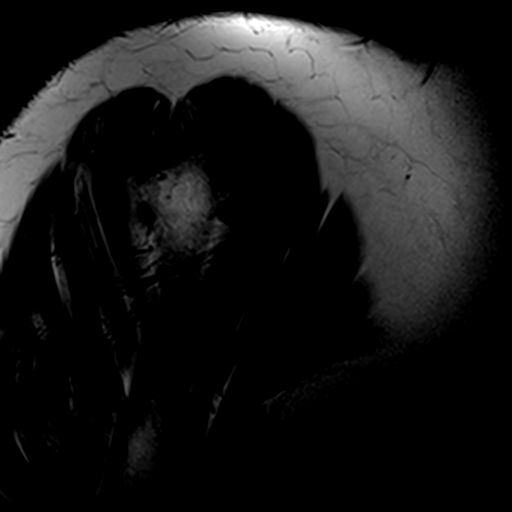
[im 26/48]
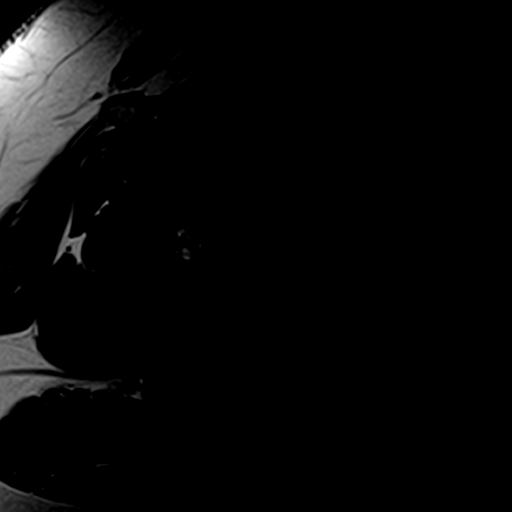
[im 33/48]
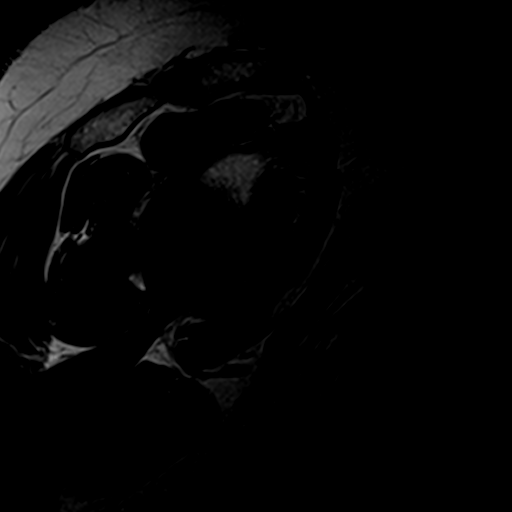
[im 40/48]
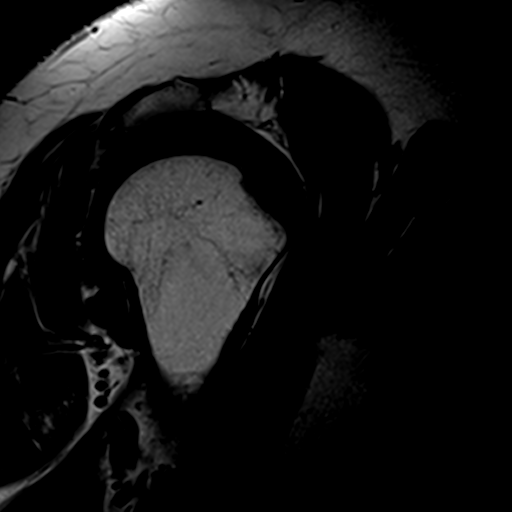
[im 48/48]
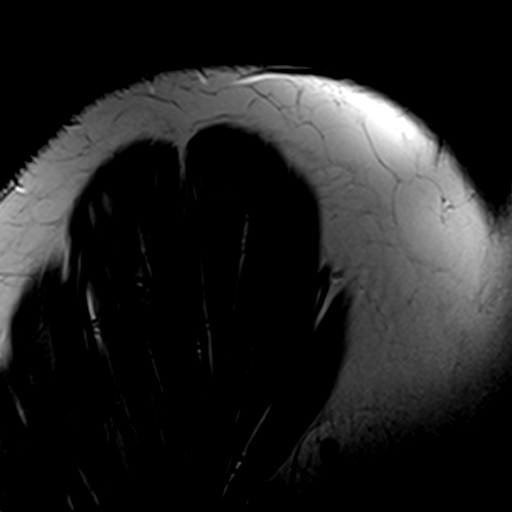

[Series 7: t2fs sagital blade · oblique · 3.0mm · 0.50mm/px · 3 of 24 slices shown]
[im 4/24]
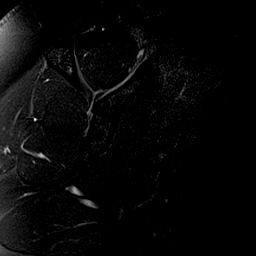
[im 12/24]
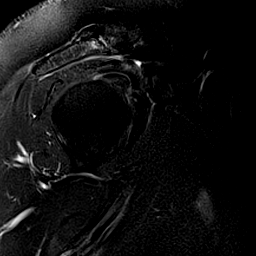
[im 20/24]
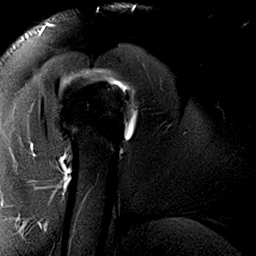

[21 of 40 positions shown; findings below may reference images not displayed]

FINDINGS: Rotator cuff: There is a shallow partial-thickness bursal surface
tear of the anterior aspect of the distal supraspinatus tendon. The
remainder of the rotator cuff is normal.

Muscles: No atrophy or abnormal signal of the muscles of the rotator
cuff.

Biceps long head:  Properly located and intact.

Acromioclavicular Joint: Minimal AC joint arthropathy. Type 1
acromion. Mild subacromial/subdeltoid bursitis.

Glenohumeral Joint: Normal.

Labrum:  Normal.

Bones:  Normal.

Other: None
IMPRESSION: 1. Small shallow bursal surface partial thickness tear of the
anterior aspect of the distal supraspinatus tendon with overlying
mild subacromial/subdeltoid bursitis.
2. Otherwise, essentially normal exam.

## 2020-01-23 ENCOUNTER — Other Ambulatory Visit: Payer: Self-pay

## 2020-01-23 ENCOUNTER — Encounter: Payer: Self-pay | Admitting: Emergency Medicine

## 2020-01-23 ENCOUNTER — Ambulatory Visit
Admission: EM | Admit: 2020-01-23 | Discharge: 2020-01-23 | Disposition: A | Discharge status: Home / Self Care | Payer: Medicare Other | Attending: Emergency Medicine | Admitting: Emergency Medicine

## 2020-01-23 DIAGNOSIS — T63301A Toxic effect of unspecified spider venom, accidental (unintentional), initial encounter: Secondary | ICD-10-CM

## 2020-01-23 MED ORDER — TRIAMCINOLONE ACETONIDE 0.1 % EX CREA: 1.0000 "application " | TOPICAL_CREAM | Freq: Two times a day (BID) | CUTANEOUS | 0 refills | Status: DC

## 2020-01-23 MED ORDER — SULFAMETHOXAZOLE-TRIMETHOPRIM 800-160 MG PO TABS: 1.0000 | ORAL_TABLET | Freq: Two times a day (BID) | ORAL | 0 refills | Status: AC

## 2020-01-23 NOTE — Discharge Instructions (Signed)
Begin bactrim twice daily for 1 week Triamcinolone cream topically to help with local swelling/inflammation Warm compresses Tylenol and ibuprofen  Follow up if not improving or worsening

## 2020-01-23 NOTE — ED Triage Notes (Signed)
Triaged by provider  

## 2020-01-24 NOTE — ED Provider Notes (Signed)
[facility]    CSN: [id number] Arrival date & time: 01/23/20  1723      History   Chief Complaint Chief Complaint  Patient presents with   Insect Bite    HPI Elizabeth Moore is a 54 y.o. female presenting today for evaluation of possible spider bite.  Patient reports that yesterday she developed an area of redness and swelling to her forehead.  She is concerned about possible spider bite as she reports she has lots of spiders at her home.  She has had associated soreness that extends down towards her nose.  Reports a sticky but clear drainage from her left eye.  Denies vision changes.  Denies any itching associated with the area.  Feels a knot.  Denies any fevers.  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 No pertinent surgical history.  OB History    Gravida  1   Para  1   Term      Preterm      AB      Living  1     SAB      TAB      Ectopic      Multiple      Live Births  1            Home Medications    Prior to Admission medications   Medication Sig Start Date End Date Taking? Authorizing Provider  cetirizine (ZYRTEC) 10 MG tablet Take 10 mg by mouth daily.    [provider]  cyclobenzaprine (FLEXERIL) 10 MG tablet Take 1 tablet (10 mg total) by mouth 3 (three) times daily as needed for muscle spasms. 09/06/18   Soyla Dryer, PA-C  diclofenac (VOLTAREN) 75 MG EC tablet Take 1 tablet (75 mg total) by mouth 2 (two) times daily as needed. 09/19/17   Carole Civil, MD  ibuprofen (ADVIL,MOTRIN) 200 MG tablet Take 600 mg by mouth every 8  (eight) hours as needed.    [provider]  lisinopril (ZESTRIL) 10 MG tablet Take 1 tablet (10 mg total) by mouth daily. 09/06/18   Soyla Dryer, PA-C  sulfamethoxazole-trimethoprim (BACTRIM DS) 800-160 MG tablet Take 1 tablet by mouth 2 (two) times daily for 7 days. 01/23/20 01/30/20  Tanicka Bisaillon C, PA-C  triamcinolone cream (KENALOG) 0.1 % Apply 1 application topically 2 (two) times daily. 01/23/20   Honi Name, Elesa Hacker, PA-C    Family History Family History  Problem Relation Age of Onset   Heart disease Mother    Cancer Father        lung   Heart disease Maternal Aunt    Cancer Paternal Uncle        Lung   Cancer Paternal Uncle        prostate cancer    Social History Social History   Tobacco Use   Smoking status: Never Smoker   Smokeless tobacco: Never Used  Vaping Use   Vaping Use: Never used  Substance Use Topics   Alcohol use: Yes    Alcohol/week: 1.0 standard drink    Types:  1 Cans of beer per week    Comment: a beer once a month   Drug use: No     Allergies   Patient has no known allergies.   Review of Systems Review of Systems  Constitutional: Negative for fatigue and fever.  Eyes: Negative for visual disturbance.  Respiratory: Negative for shortness of breath.   Cardiovascular: Negative for chest pain.  Gastrointestinal: Negative for abdominal pain, nausea and vomiting.  Musculoskeletal: Negative for arthralgias and joint swelling.  Skin: Positive for color change and rash. Negative for wound.  Neurological: Negative for dizziness, weakness, light-headedness and headaches.     Physical Exam Triage Vital Signs ED Triage Vitals [01/23/20 1846]  Enc Vitals Group     BP (!) 142/90     Pulse Rate 87     Resp 17     Temp 98.7 F (37.1 C)     Temp Source Tympanic     SpO2 95 %     Weight      Height      Head Circumference      Peak Flow      Pain Score      Pain Loc      Pain Edu?      Excl. in [place]?    No data  found.  Updated Vital Signs BP (!) 142/90 (BP Location: Right Arm)    Pulse 87    Temp 98.7 F (37.1 C) (Tympanic)    Resp 17    SpO2 95%   Visual Acuity Right Eye Distance:   Left Eye Distance:   Bilateral Distance:    Right Eye Near:   Left Eye Near:    Bilateral Near:     Physical Exam Vitals and nursing note reviewed.  Constitutional:      Appearance: She is well-developed.     Comments: No acute distress  HENT:     Head: Normocephalic and atraumatic.     Nose: Nose normal.  Eyes:     Extraocular Movements: Extraocular movements intact.     Conjunctiva/sclera: Conjunctivae normal.     Pupils: Pupils are equal, round, and reactive to light.     Comments: No conjunctival erythema, no periorbital swelling or erythema, no discharge noted from eyes bilaterally  Cardiovascular:     Rate and Rhythm: Normal rate.  Pulmonary:     Effort: Pulmonary effort is normal. No respiratory distress.  Abdominal:     General: There is no distension.  Musculoskeletal:        General: Normal range of motion.     Cervical back: Neck supple.  Skin:    General: Skin is warm and dry.     Comments: Central forehead with area of erythema swelling and mobile knot, no fluctuance or induration  Neurological:     Mental Status: She is alert and oriented to person, place, and time.      UC Treatments / Results  Labs (all labs ordered are listed, but only abnormal results are displayed) Labs Reviewed - No data to display  EKG   Radiology No results found.  Procedures Procedures (including critical care time)  Medications Ordered in UC Medications - No data to display  Initial Impression / Assessment and Plan / UC Course  I have reviewed the triage vital signs and the nursing notes.  Pertinent labs & imaging results that were available during my care of the patient were reviewed by me and considered in my medical decision making (see  chart for details).     Area is concerning  for possible bite versus early abscess.  Treating with Bactrim, as well to go to the beach, will hold off on doxycycline.  Triamcinolone topically, warm compresses and anti-inflammatories with close monitoring.  No systemic symptoms at this time.  Discussed strict return precautions. Patient verbalized understanding and is agreeable with plan.  Final Clinical Impressions(s) / UC Diagnoses   Final diagnoses:  Spider bite wound, accidental or unintentional, initial encounter     Discharge Instructions     Begin bactrim twice daily for 1 week Triamcinolone cream topically to help with local swelling/inflammation Warm compresses Tylenol and ibuprofen  Follow up if not improving or worsening   ED Prescriptions    Medication Sig Dispense Auth. Provider   triamcinolone cream (KENALOG) 0.1 % Apply 1 application topically 2 (two) times daily. 30 g Lesia Monica C, PA-C   sulfamethoxazole-trimethoprim (BACTRIM DS) 800-160 MG tablet Take 1 tablet by mouth 2 (two) times daily for 7 days. 14 tablet Elynore Dolinski, [place] C, PA-C     PDMP not reviewed this encounter.   Janith Lima, [place] 01/24/20 [phone]

## 2020-02-20 ENCOUNTER — Emergency Department (HOSPITAL_COMMUNITY): Payer: Medicare Other

## 2020-02-20 ENCOUNTER — Encounter (HOSPITAL_COMMUNITY): Payer: Self-pay | Admitting: *Deleted

## 2020-02-20 ENCOUNTER — Emergency Department (HOSPITAL_COMMUNITY)
Admission: EM | Admit: 2020-02-20 | Discharge: 2020-02-20 | Disposition: A | Discharge status: Home / Self Care | Payer: Medicare Other | Attending: Emergency Medicine | Admitting: Emergency Medicine

## 2020-02-20 ENCOUNTER — Other Ambulatory Visit: Payer: Self-pay

## 2020-02-20 DIAGNOSIS — Z79899 Other long term (current) drug therapy: Secondary | ICD-10-CM | POA: Insufficient documentation

## 2020-02-20 DIAGNOSIS — I10 Essential (primary) hypertension: Secondary | ICD-10-CM | POA: Diagnosis not present

## 2020-02-20 DIAGNOSIS — M25552 Pain in left hip: Secondary | ICD-10-CM | POA: Diagnosis present

## 2020-02-20 DIAGNOSIS — Z8541 Personal history of malignant neoplasm of cervix uteri: Secondary | ICD-10-CM | POA: Insufficient documentation

## 2020-02-20 DIAGNOSIS — R52 Pain, unspecified: Secondary | ICD-10-CM

## 2020-02-20 MED ORDER — DICLOFENAC SODIUM 75 MG PO TBEC: 75.0000 mg | DELAYED_RELEASE_TABLET | Freq: Two times a day (BID) | ORAL | 0 refills | Status: DC

## 2020-02-20 NOTE — ED Provider Notes (Signed)
[facility] EMERGENCY DEPARTMENT Provider Note   CSN: [id number] Arrival date & time: 02/20/20  1443     History Chief Complaint  Patient presents with  . Hip Pain    Elizabeth Moore is a 54 y.o. female.  The history is provided by the patient. No language interpreter was used.  Hip Pain This is a new problem. The current episode started more than 1 week ago. The problem occurs constantly. The problem has been gradually worsening. Nothing aggravates the symptoms. Nothing relieves the symptoms. She has tried nothing for the symptoms. The treatment provided moderate relief.  Pt complains of left hip pain.  Pt denies any injury.  Pt reports she has pain with walking.  Pt was seen at Urgent care and treated with prednisone.  Pt reports pain has continued.      Past Medical History:  Diagnosis Date  . Arthritis   . Cervical cancer [facility]) age 36  . GERD (gastroesophageal reflux disease)     Patient Active Problem List   Diagnosis Date Noted  . Essential hypertension 06/07/2018  . Urge incontinence of urine 07/25/2017  . 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 Hyperlipidemia 07/06/2017  . Chronic pain of both knees 07/06/2017  . Left ovarian cyst 07/06/2017    History reviewed. No pertinent surgical history.   OB History    Gravida  1   Para  1   Term      Preterm      AB      Living  1     SAB      TAB      Ectopic      Multiple      Live Births  1           Family History  Problem Relation Age of Onset  . Heart disease Mother   . Cancer Father        lung  . Heart disease Maternal Aunt   . Cancer Paternal Uncle        Lung  . Cancer Paternal Uncle        prostate cancer    Social History   Tobacco Use  . Smoking status: Never Smoker  . Smokeless tobacco: Never Used  Vaping Use  . Vaping Use: Never used  Substance Use Topics  . Alcohol use: Yes    Alcohol/week: 1.0 standard drink    Types: 1 Cans of beer per week    Comment: a beer once  a month  . Drug use: No    Home Medications Prior to Admission medications   Medication Sig Start Date End Date Taking? Authorizing Provider  cetirizine (ZYRTEC) 10 MG tablet Take 10 mg by mouth daily.    [provider]  cyclobenzaprine (FLEXERIL) 10 MG tablet Take 1 tablet (10 mg total) by mouth 3 (three) times daily as needed for muscle spasms. 09/06/18   Soyla Dryer, PA-C  diclofenac (VOLTAREN) 75 MG EC tablet Take 1 tablet (75 mg total) by mouth 2 (two) times daily as needed. 09/19/17   Carole Civil, MD  ibuprofen (ADVIL,MOTRIN) 200 MG tablet Take 600 mg by mouth every 8 (eight) hours as needed.    [provider]  lisinopril (ZESTRIL) 10 MG tablet Take 1 tablet (10 mg total) by mouth daily. 09/06/18   Soyla Dryer, PA-C  triamcinolone cream (KENALOG) 0.1 % Apply 1 application topically 2 (two) times daily. 01/23/20   Wieters, Hallie C, PA-C    Allergies  Patient has no known allergies.  Review of Systems   Review of Systems  All other systems reviewed and are negative.   Physical Exam Updated Vital Signs BP (!) 150/99 (BP Location: Right Arm)   Pulse 81   Temp 98.6 F (37 C) (Oral)   Resp 18   Ht 5\' 5"  (1.651 m)   Wt 90.7 kg   SpO2 98%   BMI 33.28 kg/m   Physical Exam Vitals and nursing note reviewed.  Constitutional:      Appearance: She is well-developed.  HENT:     Head: Normocephalic.  Cardiovascular:     Rate and Rhythm: Normal rate.  Pulmonary:     Effort: Pulmonary effort is normal.  Musculoskeletal:        General: Tenderness present. No swelling. Normal range of motion.     Cervical back: Normal range of motion.  Skin:    General: Skin is warm.  Neurological:     General: No focal deficit present.     Mental Status: She is alert and oriented to person, place, and time.  Psychiatric:        Mood and Affect: Mood normal.     ED Results / Procedures / Treatments   Labs (all labs ordered are listed, but only  abnormal results are displayed) Labs Reviewed - No data to display  EKG None  Radiology DG HIP UNILAT WITH PELVIS 2-3 VIEWS LEFT  Result Date: 02/20/2020 CLINICAL DATA:  Left hip pain EXAM: DG HIP (WITH OR WITHOUT PELVIS) 2-3V LEFT COMPARISON:  11/21/2017 FINDINGS: There is no evidence of hip fracture or dislocation. There is no evidence of arthropathy or other focal bone abnormality. IMPRESSION: Negative. Electronically Signed   By: Fidela Salisbury MD   On: 02/20/2020 17:08    Procedures Procedures (including critical care time)  Medications Ordered in ED Medications - No data to display  ED Course  I have reviewed the triage vital signs and the nursing notes.  Pertinent labs & imaging results that were available during my care of the patient were reviewed by me and considered in my medical decision making (see chart for details).    MDM Rules/Calculators/A&P                         MDM:  Pt's xray is normal.  Pt advised to schedule appointment to see Orthopaedist for evalaution   Final Clinical Impression(s) / ED Diagnoses Final diagnoses:  Left hip pain    Rx / DC Orders ED Discharge Orders         Ordered    diclofenac (VOLTAREN) 75 MG EC tablet  2 times daily        02/20/20 1800        An After Visit Summary was printed and given to the patient.    Fransico Meadow, [place] 02/20/20 1806    Fredia Sorrow, MD 02/22/20 [phone]

## 2020-02-20 NOTE — Discharge Instructions (Signed)
Schedule to see Dr. Amedeo Kinsman Orthopaedist for evaluation.  Call tomorrow for appointment

## 2020-02-20 NOTE — ED Notes (Addendum)
Pt up and ambulatory to restroom without assistance from staff.

## 2020-02-20 NOTE — ED Notes (Signed)
X Ray at bedside at this time.  

## 2020-02-20 NOTE — ED Triage Notes (Signed)
Pt c/o left hip pain x 2 weeks, worsening this last week. Pt saw Urgent Care recently and was given Prednisone and a shot, but pt reports the pain is still no better.

## 2020-02-20 NOTE — ED Notes (Signed)
I assumed care of this patient at this time. At the time of assuming care, bed is locked in the lowest position and side rails x1 per patient request, call light within reach. All questions and concerns voiced addressed by this RN at this time. Educated on hourly rounding and verbalized understanding at this time. Refreshments offered and declined at this time.

## 2020-02-22 ENCOUNTER — Ambulatory Visit (INDEPENDENT_AMBULATORY_CARE_PROVIDER_SITE_OTHER): Payer: Medicare Other | Admitting: Orthopedic Surgery

## 2020-02-22 ENCOUNTER — Encounter: Payer: Self-pay | Admitting: Orthopedic Surgery

## 2020-02-22 ENCOUNTER — Other Ambulatory Visit: Payer: Self-pay

## 2020-02-22 ENCOUNTER — Ambulatory Visit: Payer: Medicare Other

## 2020-02-22 VITALS — BP 118/79 | HR 85 | Ht 65.0 in | Wt 206.0 lb

## 2020-02-22 DIAGNOSIS — M5442 Lumbago with sciatica, left side: Secondary | ICD-10-CM | POA: Diagnosis not present

## 2020-02-22 MED ORDER — CYCLOBENZAPRINE HCL 5 MG PO TABS: 5.0000 mg | ORAL_TABLET | Freq: Two times a day (BID) | ORAL | 0 refills | Status: AC | PRN

## 2020-02-22 NOTE — Patient Instructions (Signed)
1.  Discussed back pain with some associated sciatica or radiculopathy, this will improve with time, but it can take several weeks 2.  Encourage you to pick up and start taking prescription of diclofenac.  If there are any issues with this prescription, please contact the office.  3.  Do not take diclofenac AND ibuprofen/advil as this can cause problems with your stomach and/or kidneys 4.  Please contact the clinic if your pain worsens, or remains the same over the next couple of months.  We may have to consider an MRI if no improvement 5.  Follow up as needed

## 2020-02-22 NOTE — Progress Notes (Signed)
New Patient Visit  Assessment: Elizabeth Moore is a 54 y.o. female with the following: 1. Left sided lower back pain with associated sciatica  Plan: I had extensive discussion with Elizabeth Moore in clinic today in regards to her left-sided lower back pain.  We reviewed the x-rays which are without obvious listhesis, but consistent with some mild degenerative changes.  There is nothing concerning on the x-rays based on my review.  I have provided her with a prescription for Flexeril to help with the spasming type pain, and I have encouraged her to start a prescription previously provided to her for diclofenac or take ibuprofen over-the-counter as needed.  We also discussed possibility of physical therapy, but she was not interested and therefore declined.  As such, I have encouraged her to remain as active as possible.  I stressed to her that she can anticipate this will gradually improve, but can sometimes take several months.  If her pain worsens, including more radiculopathy or does not improve in the coming months, I have encouraged her to return to clinic.  At that time, we may consider an MRI and/or referral to a spine surgeon.  Follow-up: Return if symptoms worsen or fail to improve.  Subjective:  Chief Complaint  Patient presents with  . Hip Pain    left hip pain x 2 weeks, using ice and heat pack    History of Present Illness: Elizabeth Moore is a 54 y.o. female who presents with left-sided lower back pain.  She states that it has been ongoing for the last 2 weeks.  She denies a specific inciting incident.  Her activities had not changed prior to the onset of her pain.  She presented to a local urgent care, and was given a medication, that she referred to as a "pain shot".  She was also prescribed diclofenac, but she did not get this prescription yet.  She has been taking ibuprofen at home, which does help with pain in her back as well as her knees.  She states she has had this  type of problem in the past, and it gradually got better.  Since the onset of her pain, she states it has improved.  She does note that she had more radiating type symptoms for the first for 5 days, with these have subsided.  She specifically denies numbness or tingling, or radiating symptoms beyond her buttock area on the left.  Bowel and bladder function is normal.  Review of Systems: No numbness or tingling No fevers or chills No issues with bowel or bladder function. No chest pain No shortness of breath  Medical History:  Past Medical History:  Diagnosis Date  . Arthritis   . Cervical cancer [facility]) age 59  . GERD (gastroesophageal reflux disease)     No past surgical history on file.  Family History  Problem Relation Age of Onset  . Heart disease Mother   . Cancer Father        lung  . Heart disease Maternal Aunt   . Cancer Paternal Uncle        Lung  . Cancer Paternal Uncle        prostate cancer   Social History   Tobacco Use  . Smoking status: Never Smoker  . Smokeless tobacco: Never Used  Vaping Use  . Vaping Use: Never used  Substance Use Topics  . Alcohol use: Yes    Alcohol/week: 1.0 standard drink    Types: 1 Cans of beer  per week    Comment: a beer once a month  . Drug use: No    No Known Allergies  Current Meds  Medication Sig  . cetirizine (ZYRTEC) 10 MG tablet Take 10 mg by mouth daily.  . diclofenac (VOLTAREN) 75 MG EC tablet Take 1 tablet (75 mg total) by mouth 2 (two) times daily.  Marland Kitchen ibuprofen (ADVIL,MOTRIN) 200 MG tablet Take 600 mg by mouth every 8 (eight) hours as needed.  Marland Kitchen lisinopril (ZESTRIL) 10 MG tablet Take 1 tablet (10 mg total) by mouth daily.  Marland Kitchen triamcinolone cream (KENALOG) 0.1 % Apply 1 application topically 2 (two) times daily.  . [DISCONTINUED] cyclobenzaprine (FLEXERIL) 10 MG tablet Take 1 tablet (10 mg total) by mouth 3 (three) times daily as needed for muscle spasms.    Objective: BP 118/79   Pulse 85   Ht 5\' 5"  (1.651  m)   Wt 206 lb (93.4 kg)   BMI 34.28 kg/m   Physical Exam:  General: Alert and oriented no acute distress. Gait: Slow gait.  Left-sided antalgic gait.  Able to get up from a seated position, but in obvious discomfort.  Evaluation of the lumbar spine demonstrates no obvious deformity.  She has point tenderness within the left buttock.  She also notes worsening pain with hyperextension of her lower back. Strength in bilateral quadriceps, hamstrings, gastroc, EHL and TA are 5/5. Patellar and Achilles tendon reflexes 1+ bilaterally Full and intact sensation in bilateral lower extremities in all dermatomes.    IMAGING: I personally reviewed the following images:  XR of the left hip demonstrates mild degenerative changes.  Joint space is maintained.  Small acetabular osteophyte, small osteophyte on femoral head-neck junction; consistent with mixed type impingement.  No offset noted on lateral  X-rays of the lumbar spine demonstrates degenerative changes without obvious listhesis.  Some wedge-shaped vertebra within the thoracic region, without corresponding sclerosis consistent with prior compression fracture.    New Medications:  Meds ordered this encounter  Medications  . cyclobenzaprine (FLEXERIL) 5 MG tablet    Sig: Take 1 tablet (5 mg total) by mouth 2 (two) times daily as needed for muscle spasms.    Dispense:  20 tablet    Refill:  0      Mordecai Rasmussen, MD  02/22/2020 9:07 AM

## 2020-03-27 ENCOUNTER — Other Ambulatory Visit: Payer: Self-pay

## 2020-03-27 ENCOUNTER — Ambulatory Visit (INDEPENDENT_AMBULATORY_CARE_PROVIDER_SITE_OTHER): Payer: Medicare Other | Admitting: Internal Medicine

## 2020-03-27 ENCOUNTER — Encounter (INDEPENDENT_AMBULATORY_CARE_PROVIDER_SITE_OTHER): Payer: Self-pay | Admitting: *Deleted

## 2020-03-27 ENCOUNTER — Encounter: Payer: Self-pay | Admitting: Internal Medicine

## 2020-03-27 VITALS — BP 152/81 | HR 96 | Temp 98.5°F | Resp 18 | Ht 65.0 in | Wt 207.4 lb

## 2020-03-27 DIAGNOSIS — M5442 Lumbago with sciatica, left side: Secondary | ICD-10-CM

## 2020-03-27 DIAGNOSIS — I10 Essential (primary) hypertension: Secondary | ICD-10-CM | POA: Diagnosis not present

## 2020-03-27 DIAGNOSIS — E785 Hyperlipidemia, unspecified: Secondary | ICD-10-CM

## 2020-03-27 DIAGNOSIS — M170 Bilateral primary osteoarthritis of knee: Secondary | ICD-10-CM

## 2020-03-27 DIAGNOSIS — E669 Obesity, unspecified: Secondary | ICD-10-CM | POA: Insufficient documentation

## 2020-03-27 DIAGNOSIS — Z23 Encounter for immunization: Secondary | ICD-10-CM | POA: Diagnosis not present

## 2020-03-27 DIAGNOSIS — Z124 Encounter for screening for malignant neoplasm of cervix: Secondary | ICD-10-CM

## 2020-03-27 DIAGNOSIS — Z1211 Encounter for screening for malignant neoplasm of colon: Secondary | ICD-10-CM

## 2020-03-27 DIAGNOSIS — Z7689 Persons encountering health services in other specified circumstances: Secondary | ICD-10-CM | POA: Insufficient documentation

## 2020-03-27 DIAGNOSIS — G8929 Other chronic pain: Secondary | ICD-10-CM

## 2020-03-27 DIAGNOSIS — N904 Leukoplakia of vulva: Secondary | ICD-10-CM

## 2020-03-27 MED ORDER — TRIAMCINOLONE ACETONIDE 0.1 % EX CREA: 1.0000 "application " | TOPICAL_CREAM | Freq: Two times a day (BID) | CUTANEOUS | 0 refills | Status: AC

## 2020-03-27 MED ORDER — LISINOPRIL 10 MG PO TABS: 10.0000 mg | ORAL_TABLET | Freq: Every day | ORAL | 4 refills | Status: DC

## 2020-03-27 NOTE — Assessment & Plan Note (Signed)
Care established Previous chart reviewed History and medications reviewed with the patient 

## 2020-03-27 NOTE — Patient Instructions (Signed)
Please start taking Lisinopril 10 mg once daily.  Please check BP at home/pharmacy and bring the log in the next visit.  Please continue to take Simvastatin for now.  Please get blood tests done before breakfast.  Please continue to perform simple back exercises.  Please follow DASH diet and perform moderate exercise/walking at least 150 mins/week.  DASH stands for Dietary Approaches to Stop Hypertension. The DASH diet is a healthy-eating plan designed to help treat or prevent high blood pressure (hypertension).  The DASH diet includes foods that are rich in potassium, calcium and magnesium. These nutrients help control blood pressure. The diet limits foods that are high in sodium, saturated fat and added sugars.  Studies have shown that the DASH diet can lower blood pressure in as little as two weeks. The diet can also lower low-density lipoprotein (LDL or "bad") cholesterol levels in the blood. High blood pressure and high LDL cholesterol levels are two major risk factors for heart disease and stroke.    DASH diet: Recommended servings The DASH diet provides daily and weekly nutritional goals. The number of servings you should have depends on your daily calorie needs.  Here's a look at the recommended servings from each food group for a 2,000-calorie-a-day DASH diet:  Grains: 6 to 8 servings a day. One serving is one slice bread, 1 ounce dry cereal, or 1/2 cup cooked cereal, rice or pasta. Vegetables: 4 to 5 servings a day. One serving is 1 cup raw leafy green vegetable, 1/2 cup cut-up raw or cooked vegetables, or 1/2 cup vegetable juice. Fruits: 4 to 5 servings a day. One serving is one medium fruit, 1/2 cup fresh, frozen or canned fruit, or 1/2 cup fruit juice. Fat-free or low-fat dairy products: 2 to 3 servings a day. One serving is 1 cup milk or yogurt, or 1 1/2 ounces cheese. Lean meats, poultry and fish: six 1-ounce servings or fewer a day. One serving is 1 ounce cooked meat,  poultry or fish, or 1 egg. Nuts, seeds and legumes: 4 to 5 servings a week. One serving is 1/3 cup nuts, 2 tablespoons peanut butter, 2 tablespoons seeds, or 1/2 cup cooked legumes (dried beans or peas). Fats and oils: 2 to 3 servings a day. One serving is 1 teaspoon soft margarine, 1 teaspoon vegetable oil, 1 tablespoon mayonnaise or 2 tablespoons salad dressing. Sweets and added sugars: 5 servings or fewer a week. One serving is 1 tablespoon sugar, jelly or jam, 1/2 cup sorbet, or 1 cup lemonade.

## 2020-03-27 NOTE — Assessment & Plan Note (Signed)
Advised to continue exercises Tylenol PRN Voltaren PRN

## 2020-03-27 NOTE — Assessment & Plan Note (Signed)
Recent episode of sciatica with chronic low back pain Flexeril PRN Tylenol PRN Advised to continue simple back exercises

## 2020-03-27 NOTE — Assessment & Plan Note (Signed)
Used to use Triamcinolone, better with it Refill provided upon patient request

## 2020-03-27 NOTE — Progress Notes (Signed)
New Patient Office Visit  Subjective:  Patient ID: Elizabeth Moore, female    DOB: 1965-08-16  Age: 53 y.o. MRN: [id number]  CC:  Chief Complaint  Patient presents with  . New Patient (Initial Visit)    new pt [facility] she is ok today she was having trouble out of nerve in her back and this has gone away     HPI Elizabeth Moore is a 54 year old female with past medical history of hypertension, hyperlipidemia, chronic knee arthritis, chronic low back pain with sciatica and obesity who presents for establishing care.  Patient complains of chronic low back pain.  She recently had an episode of severe left hip/buttock pain about 2 weeks ago.  She describes the pain as sharp, radiating to left LE, worse with movement and now better with flexibility and exercises.  She denies any numbness weakness or tingling in the LE.  She also has a history of arthritis of both the knees.  Her BP was 152/81 in the office today.  Of note, patient has stopped taking lisinopril for the last 3 months as she thought she was in a better mental state and thought her blood pressure would be controlled.  She denies any headache, dizziness, chest pain, dyspnea or palpitations.  She complains of itching and rash in the vulvar area, for which she used to use Kenalog.  She states that it helped with rash and requests a refill for it.  Last Pap smear about 3 years ago. Patient has not had colonoscopy yet.  She has not had COVID vaccine yet.  She is not willing to take it soon.  She received flu vaccine in the office today.  She is advised to take Shingrix vaccine.    Past Medical History:  Diagnosis Date  . Arthritis   . Cervical cancer [facility]) age 45  . GERD (gastroesophageal reflux disease)   . Hypertension    Phreesia 03/26/2020    Past Surgical History:  Procedure Laterality Date  . CESAREAN SECTION      Family History  Problem Relation Age of Onset  . Heart disease Mother   .  Cancer Father        lung  . Heart disease Maternal Aunt   . Cancer Paternal Uncle        Lung  . Cancer Paternal Uncle        prostate cancer    Social History   Socioeconomic History  . Marital status: Legally Separated    Spouse name: Not on file  . Number of children: Not on file  . Years of education: Not on file  . Highest education level: Not on file  Occupational History  . Not on file  Tobacco Use  . Smoking status: Never Smoker  . Smokeless tobacco: Never Used  Vaping Use  . Vaping Use: Never used  Substance and Sexual Activity  . Alcohol use: Yes    Alcohol/week: 1.0 standard drink    Types: 1 Cans of beer per week    Comment: a beer once a month  . Drug use: No  . Sexual activity: Not Currently    Birth control/protection: Post-menopausal  Other Topics Concern  . Not on file  Social History Narrative  . Not on file   Social Determinants of Health   Financial Resource Strain:   . Difficulty of Paying Living Expenses: Not on file  Food Insecurity:   . Worried About Charity fundraiser  in the Last Year: Not on file  . Ran Out of Food in the Last Year: Not on file  Transportation Needs:   . Lack of Transportation (Medical): Not on file  . Lack of Transportation (Non-Medical): Not on file  Physical Activity:   . Days of Exercise per Week: Not on file  . Minutes of Exercise per Session: Not on file  Stress:   . Feeling of Stress : Not on file  Social Connections:   . Frequency of Communication with Friends and Family: Not on file  . Frequency of Social Gatherings with Friends and Family: Not on file  . Attends Religious Services: Not on file  . Active Member of Clubs or Organizations: Not on file  . Attends Archivist Meetings: Not on file  . Marital Status: Not on file  Intimate Partner Violence:   . Fear of Current or Ex-Partner: Not on file  . Emotionally Abused: Not on file  . Physically Abused: Not on file  . Sexually Abused: Not on  file    ROS Review of Systems  Constitutional: Negative for chills and fever.  HENT: Negative for congestion, sinus pressure, sinus pain and sore throat.   Eyes: Negative for pain and discharge.  Respiratory: Negative for cough and shortness of breath.   Cardiovascular: Negative for chest pain and palpitations.  Gastrointestinal: Negative for abdominal pain, constipation, diarrhea, nausea and vomiting.  Endocrine: Negative for polydipsia and polyuria.  Genitourinary: Negative for dysuria and hematuria.  Musculoskeletal: Positive for back pain. Negative for neck pain and neck stiffness.  Skin: Negative for pallor.  Neurological: Negative for dizziness, seizures, syncope, weakness and headaches.  Psychiatric/Behavioral: Negative for agitation and behavioral problems.    Objective:   Today's Vitals: BP (!) 152/81 (BP Location: Right Arm, Patient Position: Sitting, Cuff Size: Normal)   Pulse 96   Temp 98.5 F (36.9 C) (Oral)   Resp 18   Ht '5\' 5"'  (1.651 m)   Wt 207 lb 6.4 oz (94.1 kg)   SpO2 99%   BMI 34.51 kg/m   Physical Exam Vitals reviewed.  Constitutional:      General: She is not in acute distress.    Appearance: She is obese. She is not diaphoretic.  HENT:     Head: Normocephalic and atraumatic.     Nose: Nose normal.     Mouth/Throat:     Mouth: Mucous membranes are moist.  Eyes:     General: No scleral icterus.    Extraocular Movements: Extraocular movements intact.     Pupils: Pupils are equal, round, and reactive to light.  Cardiovascular:     Rate and Rhythm: Normal rate and regular rhythm.     Pulses: Normal pulses.     Heart sounds: Normal heart sounds. No murmur heard.   Pulmonary:     Breath sounds: Normal breath sounds. No wheezing or rales.  Abdominal:     Palpations: Abdomen is soft.     Tenderness: There is no abdominal tenderness.  Musculoskeletal:     Cervical back: Neck supple. No tenderness.     Right lower leg: No edema.     Left lower  leg: No edema.  Skin:    General: Skin is warm.     Findings: No rash.  Neurological:     General: No focal deficit present.     Mental Status: She is alert and oriented to person, place, and time.     Sensory: No sensory deficit.  Motor: No weakness.  Psychiatric:        Mood and Affect: Mood normal.        Behavior: Behavior normal.     Assessment & Plan:   Problem List Items Addressed This Visit      Encounter to establish care - Primary   Care established Previous chart reviewed History and medications reviewed with the patient     Relevant Orders  CBC with Differential  CMP14+EGFR  Lipid Profile  TSH + free T4  Urinalysis, Routine w reflex microscopic  Vitamin D (25 hydroxy)    Cardiovascular and Mediastinum   Essential hypertension    BP Readings from Last 1 Encounters:  03/27/20 (!) 152/81   Uncontrolled due to noncompliance Restart Lisinopril 10 mg QD, refills provided Counseled for compliance with the medications Advised to check BP at home/pharmacy and bring the log into the next visit Advised DASH diet and moderate exercise/walking, at least 150 mins/week  EKG: Sinus rhythm.  HR 79.  No signs of active ischemia.       Relevant Medications   simvastatin (ZOCOR) 20 MG tablet   lisinopril (ZESTRIL) 10 MG tablet   Other Relevant Orders   TSH + free T4   Urinalysis, Routine w reflex microscopic     Nervous and Auditory   Chronic low back pain with left-sided sciatica    Recent episode of sciatica with chronic low back pain Flexeril PRN Tylenol PRN Advised to continue simple back exercises      Relevant Orders   Vitamin D (25 hydroxy)     Musculoskeletal and Integument   Arthritis of knee, degenerative    Advised to continue exercises Tylenol PRN Voltaren PRN        Genitourinary   Vulvar dystrophy    Used to use Triamcinolone, better with it Refill provided upon patient request      Relevant Medications   triamcinolone cream  (KENALOG) 0.1 %     Other   Hyperlipidemia    On Simvastatin Check lipid profile      Relevant Medications   simvastatin (ZOCOR) 20 MG tablet   lisinopril (ZESTRIL) 10 MG tablet   Other Relevant Orders   Lipid Profile      Obesity (BMI 30-39.9)    Diet modification and moderate exercise advised       Other Visit Diagnoses    Need for immunization against influenza       Relevant Orders   Flu Vaccine QUAD 36+ mos IM (Completed)   Special screening for malignant neoplasms, colon       Relevant Orders   Ambulatory referral to Gastroenterology   Routine cervical smear       Relevant Orders   Ambulatory referral to Obstetrics / Gynecology      Outpatient Encounter Medications as of 03/27/2020  Medication Sig  . cyclobenzaprine (FLEXERIL) 5 MG tablet Take 1 tablet (5 mg total) by mouth 2 (two) times daily as needed for muscle spasms.  . diclofenac (VOLTAREN) 75 MG EC tablet Take 1 tablet (75 mg total) by mouth 2 (two) times daily.  Marland Kitchen ibuprofen (ADVIL,MOTRIN) 200 MG tablet Take 600 mg by mouth every 8 (eight) hours as needed.  Marland Kitchen lisinopril (ZESTRIL) 10 MG tablet Take 1 tablet (10 mg total) by mouth daily.  . simvastatin (ZOCOR) 20 MG tablet Take 20 mg by mouth daily.  . [DISCONTINUED] lisinopril (ZESTRIL) 10 MG tablet Take 1 tablet (10 mg total) by mouth daily.  . [DISCONTINUED] tiZANidine (  ZANAFLEX) 4 MG tablet Take 4 mg by mouth every 6 (six) hours as needed for muscle spasms.  Marland Kitchen triamcinolone cream (KENALOG) 0.1 % Apply 1 application topically 2 (two) times daily.  . [DISCONTINUED] cetirizine (ZYRTEC) 10 MG tablet Take 10 mg by mouth daily. (Patient not taking: Reported on 03/27/2020)  . [DISCONTINUED] triamcinolone cream (KENALOG) 0.1 % Apply 1 application topically 2 (two) times daily. (Patient not taking: Reported on 03/27/2020)   No facility-administered encounter medications on file as of 03/27/2020.    Follow-up: Return in about 6 weeks (around 05/08/2020).    Lindell Spar, MD

## 2020-03-27 NOTE — Assessment & Plan Note (Signed)
On Simvastatin Check lipid profile 

## 2020-03-27 NOTE — Assessment & Plan Note (Signed)
Diet modification and moderate exercise advised. 

## 2020-03-27 NOTE — Assessment & Plan Note (Addendum)
BP Readings from Last 1 Encounters:  03/27/20 (!) 152/81   Uncontrolled due to noncompliance Restart Lisinopril 10 mg QD, refills provided Counseled for compliance with the medications Advised to check BP at home/pharmacy and bring the log into the next visit Advised DASH diet and moderate exercise/walking, at least 150 mins/week  EKG: Sinus rhythm.  HR 79.  No signs of active ischemia.

## 2020-04-22 LAB — CBC WITH DIFFERENTIAL/PLATELET
Basophils Absolute: 0.1 10*3/uL (ref 0.0–0.2)
Basos: 1 %
EOS (ABSOLUTE): 0.2 10*3/uL (ref 0.0–0.4)
Eos: 3 %
Hematocrit: 46.1 % (ref 34.0–46.6)
Hemoglobin: 14.8 g/dL (ref 11.1–15.9)
Immature Grans (Abs): 0 10*3/uL (ref 0.0–0.1)
Immature Granulocytes: 0 %
Lymphocytes Absolute: 2 10*3/uL (ref 0.7–3.1)
Lymphs: 34 %
MCH: 28.4 pg (ref 26.6–33.0)
MCHC: 32.1 g/dL (ref 31.5–35.7)
MCV: 88 fL (ref 79–97)
Monocytes Absolute: 0.6 10*3/uL (ref 0.1–0.9)
Monocytes: 10 %
Neutrophils Absolute: 3.1 10*3/uL (ref 1.4–7.0)
Neutrophils: 52 %
Platelets: 254 10*3/uL (ref 150–450)
RBC: 5.22 x10E6/uL (ref 3.77–5.28)
RDW: 13 % (ref 11.7–15.4)
WBC: 6 10*3/uL (ref 3.4–10.8)

## 2020-04-22 LAB — CMP14+EGFR
ALT: 19 IU/L (ref 0–32)
AST: 17 IU/L (ref 0–40)
Albumin/Globulin Ratio: 1.8 (ref 1.2–2.2)
Albumin: 4.6 g/dL (ref 3.8–4.9)
Alkaline Phosphatase: 104 IU/L (ref 44–121)
BUN/Creatinine Ratio: 13 (ref 9–23)
BUN: 12 mg/dL (ref 6–24)
Bilirubin Total: 0.6 mg/dL (ref 0.0–1.2)
CO2: 20 mmol/L (ref 20–29)
Calcium: 9.6 mg/dL (ref 8.7–10.2)
Chloride: 105 mmol/L (ref 96–106)
Creatinine, Ser: 0.91 mg/dL (ref 0.57–1.00)
GFR calc Af Amer: 83 mL/min/{1.73_m2} (ref >59)
GFR calc non Af Amer: 72 mL/min/{1.73_m2} (ref >59)
Globulin, Total: 2.6 g/dL (ref 1.5–4.5)
Glucose: 87 mg/dL (ref 65–99)
Potassium: 4.5 mmol/L (ref 3.5–5.2)
Sodium: 141 mmol/L (ref 134–144)
Total Protein: 7.2 g/dL (ref 6.0–8.5)

## 2020-04-22 LAB — TSH+FREE T4
Free T4: 1.15 ng/dL (ref 0.82–1.77)
TSH: 0.675 u[IU]/mL (ref 0.450–4.500)

## 2020-04-22 LAB — LIPID PANEL
Chol/HDL Ratio: 4.5 ratio — ABNORMAL HIGH (ref 0.0–4.4)
Cholesterol, Total: 189 mg/dL (ref 100–199)
HDL: 42 mg/dL
LDL Chol Calc (NIH): 117 mg/dL — ABNORMAL HIGH (ref 0–99)
Triglycerides: 172 mg/dL — ABNORMAL HIGH (ref 0–149)
VLDL Cholesterol Cal: 30 mg/dL (ref 5–40)

## 2020-04-22 LAB — VITAMIN D 25 HYDROXY (VIT D DEFICIENCY, FRACTURES): Vit D, 25-Hydroxy: 18.8 ng/mL — ABNORMAL LOW (ref 30.0–100.0)

## 2020-05-07 ENCOUNTER — Encounter: Payer: Medicare Other | Admitting: Adult Health

## 2020-05-08 ENCOUNTER — Telehealth (INDEPENDENT_AMBULATORY_CARE_PROVIDER_SITE_OTHER): Payer: Medicare Other | Admitting: Internal Medicine

## 2020-05-08 ENCOUNTER — Other Ambulatory Visit: Payer: Self-pay

## 2020-05-08 ENCOUNTER — Encounter: Payer: Self-pay | Admitting: Internal Medicine

## 2020-05-08 DIAGNOSIS — M5442 Lumbago with sciatica, left side: Secondary | ICD-10-CM

## 2020-05-08 DIAGNOSIS — M170 Bilateral primary osteoarthritis of knee: Secondary | ICD-10-CM | POA: Diagnosis not present

## 2020-05-08 DIAGNOSIS — I10 Essential (primary) hypertension: Secondary | ICD-10-CM | POA: Diagnosis not present

## 2020-05-08 DIAGNOSIS — E785 Hyperlipidemia, unspecified: Secondary | ICD-10-CM

## 2020-05-08 DIAGNOSIS — G8929 Other chronic pain: Secondary | ICD-10-CM

## 2020-05-08 DIAGNOSIS — E559 Vitamin D deficiency, unspecified: Secondary | ICD-10-CM | POA: Insufficient documentation

## 2020-05-08 MED ORDER — VITAMIN D (ERGOCALCIFEROL) 1.25 MG (50000 UNIT) PO CAPS: 50000.0000 [IU] | ORAL_CAPSULE | ORAL | 5 refills | Status: AC

## 2020-05-08 NOTE — Assessment & Plan Note (Signed)
Advised to continue exercises Tylenol PRN Voltaren PRN Check X-ray of b/l knee, plan for Orthopedic referral if progressive arthritis

## 2020-05-08 NOTE — Progress Notes (Signed)
Virtual Visit via Telephone Note   This visit type was conducted due to national recommendations for restrictions regarding the COVID-19 Pandemic (e.g. social distancing) in an effort to limit this patient's exposure and mitigate transmission in our community.  Due to her co-morbid illnesses, this patient is at least at moderate risk for complications without adequate follow up.  This format is felt to be most appropriate for this patient at this time.  The patient did not have access to video technology/had technical difficulties with video requiring transitioning to audio format only (telephone).  All issues noted in this document were discussed and addressed.  No physical exam could be performed with this format.  Evaluation Performed:  Follow-up visit  Date:  05/08/2020   ID:  Elizabeth Moore, DOB [birth year], MRN [id number]  Patient Location: Home Provider Location: Office/Clinic  Participants: Patient Location of Patient: Home Location of Provider: Telehealth Consent was obtain for visit to be over via telehealth. I verified that I am speaking with the correct person using two identifiers.  PCP:  Lindell Spar, MD   Chief Complaint:  Follow up of chronic medical conditions  History of Present Illness:    Elizabeth Moore is a 54 y.o. female with past medical history of hypertension, hyperlipidemia, chronic knee arthritis, chronic low back pain with sciatica and obesity who has a televisit for follow up of her chronic medical conditions.  Patient states that she has been doing well overall. BP is well-controlled at home. Takes medications regularly. Patient denies headache, dizziness, chest pain, dyspnea or palpitations.  Patient c/o chronic low back pain and b/l knee pain. She had knee x-rays in 2019. She states that the knee pain was progressively getting worse, but now it has been better since she does not have to work. She denies any numbness, tingling or weakness  in the legs.  Blood tests were reviewed and discussed with the patient in detail.   The patient does not have symptoms concerning for COVID-19 infection (fever, chills, cough, or new shortness of breath).   Past Medical, Surgical, Social History, Allergies, and Medications have been Reviewed.  Past Medical History:  Diagnosis Date  . Arthritis   . Cervical cancer [facility]) age 33  . GERD (gastroesophageal reflux disease)   . Hypertension    Phreesia 03/26/2020   Past Surgical History:  Procedure Laterality Date  . CESAREAN SECTION       Current Meds  Medication Sig  . cyclobenzaprine (FLEXERIL) 5 MG tablet Take 1 tablet (5 mg total) by mouth 2 (two) times daily as needed for muscle spasms.  . diclofenac (VOLTAREN) 75 MG EC tablet Take 1 tablet (75 mg total) by mouth 2 (two) times daily.  Marland Kitchen ibuprofen (ADVIL,MOTRIN) 200 MG tablet Take 600 mg by mouth every 8 (eight) hours as needed.  Marland Kitchen lisinopril (ZESTRIL) 10 MG tablet Take 1 tablet (10 mg total) by mouth daily.  . simvastatin (ZOCOR) 20 MG tablet Take 20 mg by mouth daily.  Marland Kitchen triamcinolone cream (KENALOG) 0.1 % Apply 1 application topically 2 (two) times daily.     Allergies:   Patient has no known allergies.   ROS:   Please see the history of present illness.    Review of Systems  Constitutional: Negative for chills and fever.  HENT: Negative for congestion, sinus pain and sore throat.   Eyes: Negative for pain and redness.  Respiratory: Negative for cough and shortness of breath.   Cardiovascular: Negative for  chest pain and palpitations.  Gastrointestinal: Negative for blood in stool, constipation, nausea and vomiting.  Genitourinary: Negative for dysuria and hematuria.  Musculoskeletal: Positive for back pain and joint pain. Negative for falls.  Skin: Negative for rash.  Neurological: Negative for dizziness, tingling, sensory change and focal weakness.  Psychiatric/Behavioral: Negative for depression and suicidal ideas.     Labs/Other Tests and Data Reviewed:    Recent Labs: 04/21/2020: ALT 19; BUN 12; Creatinine, Ser 0.91; Hemoglobin 14.8; Platelets 254; Potassium 4.5; Sodium 141; TSH 0.675   Recent Lipid Panel Lab Results  Component Value Date/Time   CHOL 189 04/21/2020 11:17 AM   TRIG 172 (H) 04/21/2020 11:17 AM   HDL 42 04/21/2020 11:17 AM   CHOLHDL 4.5 (H) 04/21/2020 11:17 AM   CHOLHDL 4.4 11/27/2018 11:37 AM   LDLCALC 117 (H) 04/21/2020 11:17 AM    Wt Readings from Last 3 Encounters:  03/27/20 207 lb 6.4 oz (94.1 kg)  02/22/20 206 lb (93.4 kg)  02/20/20 200 lb (90.7 kg)      ASSESSMENT & PLAN:    Essential hypertension Well-controlled at home Continue Lisinopril 10 mg QD Counseled for compliance with the medications Advised to check BP at home/pharmacy and bring the log into the next visit Advised DASH diet and moderate exercise/walking, at least 150 mins/week  Hyperlipidemia On Simvastatin  Vitamin D deficiency Last vitamin D Lab Results  Component Value Date   VD25OH 18.8 (L) 04/21/2020   Started Vitamin D 50,000 IU every week  Arthritis of knee, degenerative Advised to continue exercises Tylenol PRN Voltaren PRN Check X-ray of b/l knee, plan for Orthopedic referral if progressive arthritis  Chronic low back pain with left-sided sciatica Flexeril PRN Tylenol PRN Advised to continue simple back exercises    Time:   Today, I have spent 23 minutes reviewing the chart, including problem list, medications, and with the patient with telehealth technology discussing the above problems.   Medication Adjustments/Labs and Tests Ordered: Current medicines are reviewed at length with the patient today.  Concerns regarding medicines are outlined above.   Tests Ordered: No orders of the defined types were placed in this encounter.   Medication Changes: No orders of the defined types were placed in this encounter.    Note: This dictation was prepared with Dragon  dictation along with smaller phrase technology. Similar sounding words can be transcribed inadequately or may not be corrected upon review. Any transcriptional errors that result from this process are unintentional.      Disposition:  Follow up  Signed, Lindell Spar, MD  05/08/2020 10:40 AM     [facility]

## 2020-05-08 NOTE — Assessment & Plan Note (Signed)
Well-controlled at home Continue Lisinopril 10 mg QD Counseled for compliance with the medications Advised to check BP at home/pharmacy and bring the log into the next visit Advised DASH diet and moderate exercise/walking, at least 150 mins/week

## 2020-05-08 NOTE — Assessment & Plan Note (Signed)
Last vitamin D Lab Results  Component Value Date   VD25OH 18.8 (L) 04/21/2020   Started Vitamin D 50,000 IU every week

## 2020-05-08 NOTE — Patient Instructions (Addendum)
Please continue to take Lisinoprol and Simvastatin regularly.  Please take Tylenol/Ibuprofen as needed up to twice daily for joint pain or back pain.  Please continue to perform simple knee and back exercises. Please get X-ray of the knee done at [facility].  Please contact OB/GYN office for scheduling PAP smear.   Knee Exercises Ask your health care provider which exercises are safe for you. Do exercises exactly as told by your health care provider and adjust them as directed. It is normal to feel mild stretching, pulling, tightness, or discomfort as you do these exercises. Stop right away if you feel sudden pain or your pain gets worse. Do not begin these exercises until told by your health care provider. Stretching and range-of-motion exercises These exercises warm up your muscles and joints and improve the movement and flexibility of your knee. These exercises also help to relieve pain and swelling. Knee extension, prone 1. Lie on your abdomen (prone position) on a bed. 2. Place your left / right knee just beyond the edge of the surface so your knee is not on the bed. You can put a towel under your left / right thigh just above your kneecap for comfort. 3. Relax your leg muscles and allow gravity to straighten your knee (extension). You should feel a stretch behind your left / right knee. 4. Hold this position for ____5______ seconds. 5. Scoot up so your knee is supported between repetitions. Repeat ___3_______ times. Complete this exercise _____2_____ times a day. Knee flexion, active  1. Lie on your back with both legs straight. If this causes back discomfort, bend your left / right knee so your foot is flat on the floor. 2. Slowly slide your left / right heel back toward your buttocks. Stop when you feel a gentle stretch in the front of your knee or thigh (flexion). 3. Hold this position for _____5_____ seconds. 4. Slowly slide your left / right heel back to the starting  position. Repeat ____3______ times. Complete this exercise ___2_______ times a day. Quadriceps stretch, prone  1. Lie on your abdomen on a firm surface, such as a bed or padded floor. 2. Bend your left / right knee and hold your ankle. If you cannot reach your ankle or pant leg, loop a belt around your foot and grab the belt instead. 3. Gently pull your heel toward your buttocks. Your knee should not slide out to the side. You should feel a stretch in the front of your thigh and knee (quadriceps). 4. Hold this position for ____3______ seconds. Repeat ___5_______ times. Complete this exercise _____2_____ times a day. Hamstring, supine 1. Lie on your back (supine position). 2. Loop a belt or towel over the ball of your left / right foot. The ball of your foot is on the walking surface, right under your toes. 3. Straighten your left / right knee and slowly pull on the belt to raise your leg until you feel a gentle stretch behind your knee (hamstring). ? Do not let your knee bend while you do this. ? Keep your other leg flat on the floor. 4. Hold this position for ___3_______ seconds. Repeat ______5____ times. Complete this exercise _____2_____ times a day. Strengthening exercises These exercises build strength and endurance in your knee. Endurance is the ability to use your muscles for a long time, even after they get tired. Quadriceps, isometric This exercise stretches the muscles in front of your thigh (quadriceps) without moving your knee joint (isometric). 1. Lie on  your back with your left / right leg extended and your other knee bent. Put a rolled towel or small pillow under your knee if told by your health care provider. 2. Slowly tense the muscles in the front of your left / right thigh. You should see your kneecap slide up toward your hip or see increased dimpling just above the knee. This motion will push the back of the knee toward the floor. 3. For ____5______ seconds, hold the muscle  as tight as you can without increasing your pain. 4. Relax the muscles slowly and completely. Repeat ____5______ times. Complete this exercise ______2____ times a day. Straight leg raises This exercise stretches the muscles in front of your thigh (quadriceps) and the muscles that move your hips (hip flexors). 1. Lie on your back with your left / right leg extended and your other knee bent. 2. Tense the muscles in the front of your left / right thigh. You should see your kneecap slide up or see increased dimpling just above the knee. Your thigh may even shake a bit. 3. Keep these muscles tight as you raise your leg 4-6 inches (10-15 cm) off the floor. Do not let your knee bend. 4. Hold this position for ____3______ seconds. 5. Keep these muscles tense as you lower your leg. 6. Relax your muscles slowly and completely after each repetition. Repeat _____5_____ times. Complete this exercise _____2_____ times a day.  Squats This exercise strengthens the muscles in front of your thigh and knee (quadriceps). 1. Stand in front of a table, with your feet and knees pointing straight ahead. You may rest your hands on the table for balance but not for support. 2. Slowly bend your knees and lower your hips like you are going to sit in a chair. ? Keep your weight over your heels, not over your toes. ? Keep your lower legs upright so they are parallel with the table legs. ? Do not let your hips go lower than your knees. ? Do not bend lower than told by your health care provider. ? If your knee pain increases, do not bend as low. 3. Hold the squat position for ____3______ seconds. 4. Slowly push with your legs to return to standing. Do not use your hands to pull yourself to standing. Repeat ____3______ times. Complete this exercise ____2______ times a day. Straight leg raises This exercise strengthens the muscles that rotate the leg at the hip and move it away from your body (hip abductors). 1. Lie on  your side with your left / right leg in the top position. Lie so your head, shoulder, knee, and hip line up. You may bend your bottom knee to help you keep your balance. 2. Roll your hips slightly forward so your hips are stacked directly over each other and your left / right knee is facing forward. 3. Leading with your heel, lift your top leg 4-6 inches (10-15 cm). You should feel the muscles in your outer hip lifting. ? Do not let your foot drift forward. ? Do not let your knee roll toward the ceiling. 4. Hold this position for ____5______ seconds. 5. Slowly return your leg to the starting position. 6. Let your muscles relax completely after each repetition. Repeat _____5_____ times. Complete this exercise ___2_______ times a day. Straight leg raises This exercise stretches the muscles that move your hips away from the front of the pelvis (hip extensors). 1. Lie on your abdomen on a firm surface. You can put a pillow  under your hips if that is more comfortable. 2. Tense the muscles in your buttocks and lift your left / right leg about 4-6 inches (10-15 cm). Keep your knee straight as you lift your leg. 3. Hold this position for ____5______ seconds. 4. Slowly lower your leg to the starting position. 5. Let your leg relax completely after each repetition. Repeat ____5______ times. Complete this exercise _____2_____ times a day. This information is not intended to replace advice given to you by your health care provider. Make sure you discuss any questions you have with your health care provider. Document Revised: 02/21/2018 Document Reviewed: 02/21/2018 Elsevier Patient Education  2020 [employer].

## 2020-05-08 NOTE — Assessment & Plan Note (Signed)
Flexeril PRN Tylenol PRN Advised to continue simple back exercises

## 2020-05-08 NOTE — Assessment & Plan Note (Signed)
On Simvastatin 

## 2020-06-16 DIAGNOSIS — Z20822 Contact with and (suspected) exposure to covid-19: Secondary | ICD-10-CM | POA: Diagnosis not present

## 2020-06-16 DIAGNOSIS — R059 Cough, unspecified: Secondary | ICD-10-CM | POA: Diagnosis not present

## 2020-06-16 DIAGNOSIS — J189 Pneumonia, unspecified organism: Secondary | ICD-10-CM | POA: Diagnosis not present

## 2020-08-06 ENCOUNTER — Encounter: Payer: Self-pay | Admitting: Internal Medicine

## 2020-08-06 ENCOUNTER — Ambulatory Visit (INDEPENDENT_AMBULATORY_CARE_PROVIDER_SITE_OTHER): Payer: Medicare HMO | Admitting: Internal Medicine

## 2020-08-06 ENCOUNTER — Other Ambulatory Visit: Payer: Self-pay

## 2020-08-06 VITALS — BP 136/86 | HR 90 | Resp 18 | Ht 65.0 in | Wt 210.1 lb

## 2020-08-06 DIAGNOSIS — I10 Essential (primary) hypertension: Secondary | ICD-10-CM | POA: Diagnosis not present

## 2020-08-06 DIAGNOSIS — J302 Other seasonal allergic rhinitis: Secondary | ICD-10-CM | POA: Diagnosis not present

## 2020-08-06 DIAGNOSIS — G8929 Other chronic pain: Secondary | ICD-10-CM

## 2020-08-06 DIAGNOSIS — M5442 Lumbago with sciatica, left side: Secondary | ICD-10-CM | POA: Diagnosis not present

## 2020-08-06 DIAGNOSIS — J40 Bronchitis, not specified as acute or chronic: Secondary | ICD-10-CM | POA: Diagnosis not present

## 2020-08-06 MED ORDER — METHYLPREDNISOLONE 4 MG PO TBPK: ORAL_TABLET | ORAL | 0 refills | Status: DC

## 2020-08-06 MED ORDER — LEVOCETIRIZINE DIHYDROCHLORIDE 5 MG PO TABS: 5.0000 mg | ORAL_TABLET | Freq: Every evening | ORAL | 6 refills | Status: AC

## 2020-08-06 MED ORDER — ALBUTEROL SULFATE HFA 108 (90 BASE) MCG/ACT IN AERS: 2.0000 | INHALATION_SPRAY | Freq: Four times a day (QID) | RESPIRATORY_TRACT | 0 refills | Status: AC | PRN

## 2020-08-06 MED ORDER — AZITHROMYCIN 250 MG PO TABS: ORAL_TABLET | ORAL | 0 refills | Status: DC

## 2020-08-06 MED ORDER — MELOXICAM 7.5 MG PO TABS: 7.5000 mg | ORAL_TABLET | Freq: Every day | ORAL | 2 refills | Status: AC

## 2020-08-06 NOTE — Patient Instructions (Addendum)
Please start taking Azithromycin and steroid as prescribed.  Please start taking Meloxicam for back pain. Do not take Ibuprofen or Aleve while taking Meloxicam.  Please continue to follow DASH diet and perform moderate exercise/walking at least 150 mins/week. [email or web address]">  DASH Eating Plan DASH stands for Dietary Approaches to Stop Hypertension. The DASH eating plan is a healthy eating plan that has been shown to:  Reduce high blood pressure (hypertension).  Reduce your risk for type 2 diabetes, heart disease, and stroke.  Help with weight loss. What are tips for following this plan? Reading food labels  Check food labels for the amount of salt (sodium) per serving. Choose foods with less than 5 percent of the Daily Value of sodium. Generally, foods with less than 300 milligrams (mg) of sodium per serving fit into this eating plan.  To find whole grains, look for the word "whole" as the first word in the ingredient list. Shopping  Buy products labeled as "low-sodium" or "no salt added."  Buy fresh foods. Avoid canned foods and pre-made or frozen meals. Cooking  Avoid adding salt when cooking. Use salt-free seasonings or herbs instead of table salt or sea salt. Check with your health care provider or pharmacist before using salt substitutes.  Do not fry foods. Cook foods using healthy methods such as baking, boiling, grilling, roasting, and broiling instead.  Cook with heart-healthy oils, such as olive, canola, avocado, soybean, or sunflower oil. Meal planning  Eat a balanced diet that includes: ? 4 or more servings of fruits and 4 or more servings of vegetables each day. Try to fill one-half of your plate with fruits and vegetables. ? 6-8 servings of whole grains each day. ? Less than 6 oz (170 g) of lean meat, poultry, or fish each day. A 3-oz (85-g) serving of meat is about the same size as a deck of cards. One egg equals 1  oz (28 g). ? 2-3 servings of low-fat dairy each day. One serving is 1 cup (237 mL). ? 1 serving of nuts, seeds, or beans 5 times each week. ? 2-3 servings of heart-healthy fats. Healthy fats called omega-3 fatty acids are found in foods such as walnuts, flaxseeds, fortified milks, and eggs. These fats are also found in cold-water fish, such as sardines, salmon, and mackerel.  Limit how much you eat of: ? Canned or prepackaged foods. ? Food that is high in trans fat, such as some fried foods. ? Food that is high in saturated fat, such as fatty meat. ? Desserts and other sweets, sugary drinks, and other foods with added sugar. ? Full-fat dairy products.  Do not salt foods before eating.  Do not eat more than 4 egg yolks a week.  Try to eat at least 2 vegetarian meals a week.  Eat more home-cooked food and less restaurant, buffet, and fast food.   Lifestyle  When eating at a restaurant, ask that your food be prepared with less salt or no salt, if possible.  If you drink alcohol: ? Limit how much you use to:  0-1 drink a day for women who are not pregnant.  0-2 drinks a day for men. ? Be aware of how much alcohol is in your drink. In the [place] one drink equals one 12 oz bottle of beer (355 mL), one 5 oz glass of wine (148 mL), or one 1 oz glass of hard liquor (44 mL). General information  Avoid eating more than 2,300 mg of salt a day. If  you have hypertension, you may need to reduce your sodium intake to 1,500 mg a day.  Work with your health care provider to maintain a healthy body weight or to lose weight. Ask what an ideal weight is for you.  Get at least 30 minutes of exercise that causes your heart to beat faster (aerobic exercise) most days of the week. Activities may include walking, swimming, or biking.  Work with your health care provider or dietitian to adjust your eating plan to your individual calorie needs. What foods should I eat? Fruits All fresh, dried, or frozen  fruit. Canned fruit in natural juice (without added sugar). Vegetables Fresh or frozen vegetables (raw, steamed, roasted, or grilled). Low-sodium or reduced-sodium tomato and vegetable juice. Low-sodium or reduced-sodium tomato sauce and tomato paste. Low-sodium or reduced-sodium canned vegetables. Grains Whole-grain or whole-wheat bread. Whole-grain or whole-wheat pasta. Brown rice. [place]. Bulgur. Whole-grain and low-sodium cereals. Pita bread. Low-fat, low-sodium crackers. Whole-wheat flour tortillas. Meats and other proteins Skinless chicken or [place]. Ground chicken or [place]. Pork with fat trimmed off. Fish and seafood. Egg whites. Dried beans, peas, or lentils. Unsalted nuts, nut butters, and seeds. Unsalted canned beans. Lean cuts of beef with fat trimmed off. Low-sodium, lean precooked or cured meat, such as sausages or meat loaves. Dairy Low-fat (1%) or fat-free (skim) milk. Reduced-fat, low-fat, or fat-free cheeses. Nonfat, low-sodium ricotta or cottage cheese. Low-fat or nonfat yogurt. Low-fat, low-sodium cheese. Fats and oils Soft margarine without trans fats. Vegetable oil. Reduced-fat, low-fat, or light mayonnaise and salad dressings (reduced-sodium). Canola, safflower, olive, avocado, soybean, and sunflower oils. Avocado. Seasonings and condiments Herbs. Spices. Seasoning mixes without salt. Other foods Unsalted popcorn and pretzels. Fat-free sweets. The items listed above may not be a complete list of foods and beverages you can eat. Contact a dietitian for more information. What foods should I avoid? Fruits Canned fruit in a light or heavy syrup. Fried fruit. Fruit in cream or butter sauce. Vegetables Creamed or fried vegetables. Vegetables in a cheese sauce. Regular canned vegetables (not low-sodium or reduced-sodium). Regular canned tomato sauce and paste (not low-sodium or reduced-sodium). Regular tomato and vegetable juice (not low-sodium or reduced-sodium).  Angie Fava. Olives. Grains Baked goods made with fat, such as croissants, muffins, or some breads. Dry pasta or rice meal packs. Meats and other proteins Fatty cuts of meat. Ribs. Fried meat. Berniece Salines. Bologna, salami, and other precooked or cured meats, such as sausages or meat loaves. Fat from the back of a pig (fatback). Bratwurst. Salted nuts and seeds. Canned beans with added salt. Canned or smoked fish. Whole eggs or egg yolks. Chicken or [place] with skin. Dairy Whole or 2% milk, cream, and half-and-half. Whole or full-fat cream cheese. Whole-fat or sweetened yogurt. Full-fat cheese. Nondairy creamers. Whipped toppings. Processed cheese and cheese spreads. Fats and oils Butter. Stick margarine. Lard. Shortening. Ghee. Bacon fat. Tropical oils, such as coconut, palm kernel, or palm oil. Seasonings and condiments Onion salt, garlic salt, seasoned salt, table salt, and sea salt. Worcestershire sauce. Tartar sauce. Barbecue sauce. Teriyaki sauce. Soy sauce, including reduced-sodium. Steak sauce. Canned and packaged gravies. Fish sauce. Oyster sauce. Cocktail sauce. Store-bought horseradish. Ketchup. Mustard. Meat flavorings and tenderizers. Bouillon cubes. Hot sauces. Pre-made or packaged marinades. Pre-made or packaged taco seasonings. Relishes. Regular salad dressings. Other foods Salted popcorn and pretzels. The items listed above may not be a complete list of foods and beverages you should avoid. Contact a dietitian for more information. Where to find more information  National Heart, Lung, and Blood Institute: https://wilson-eaton.com/  American Heart Association: www.heart.org  Academy of Nutrition and Dietetics: www.eatright.[place]: www.kidney.org Summary  The DASH eating plan is a healthy eating plan that has been shown to reduce high blood pressure (hypertension). It may also reduce your risk for type 2 diabetes, heart disease, and stroke.  When on the DASH eating  plan, aim to eat more fresh fruits and vegetables, whole grains, lean proteins, low-fat dairy, and heart-healthy fats.  With the DASH eating plan, you should limit salt (sodium) intake to 2,300 mg a day. If you have hypertension, you may need to reduce your sodium intake to 1,500 mg a day.  Work with your health care provider or dietitian to adjust your eating plan to your individual calorie needs. This information is not intended to replace advice given to you by your health care provider. Make sure you discuss any questions you have with your health care provider. Document Revised: 04/06/2019 Document Reviewed: 04/06/2019 Elsevier Patient Education  2021 [employer].

## 2020-08-06 NOTE — Assessment & Plan Note (Signed)
Well-controlled at home Continue Lisinopril 10 mg QD Counseled for compliance with the medications Advised DASH diet and moderate exercise/walking, at least 150 mins/week

## 2020-08-06 NOTE — Progress Notes (Signed)
Established Patient Office Visit  Subjective:  Patient ID: Elizabeth Moore, female    DOB: Oct 20, 1965  Age: 55 y.o. MRN: [id number]  CC:  Chief Complaint  Patient presents with  . Follow-up    3 month follow up pt has had cough cant seem to shake for about 2 months had pneumonia got better last few days cough has come back     HPI Elizabeth Moore is a 55 year old female with past medical history of hypertension, hyperlipidemia, chronic knee arthritis, chronic low back pain with sciatica and obesity who  presents for follow up of her chronic medical conditions.  HTN: BP is well-controlled. Takes medications regularly. Patient denies headache, dizziness, chest pain, dyspnea or palpitations.  Low back pain: She has been taking 3-4 Ibuprofen daily for back pain. She visited Orthopedic surgeon for it. She was prescribed Flexeril, but she rarely takes it when she has severe pain. She requests a letter for her jury duty as she is not able to sit for prolonged period due to severe back pain.  Bronchitis: She has been having cough with whitish sputum. She also has been having wheezing for last 1 week. Denies any fever, chills, sore throat, nasal congestion or sinus pain/pressure. She reports environmental allergies.  Past Medical History:  Diagnosis Date  . Arthritis   . Cervical cancer [facility]) age 3  . GERD (gastroesophageal reflux disease)   . Hypertension    Phreesia 03/26/2020    Past Surgical History:  Procedure Laterality Date  . CESAREAN SECTION      Family History  Problem Relation Age of Onset  . Heart disease Mother   . Cancer Father        lung  . Heart disease Maternal Aunt   . Cancer Paternal Uncle        Lung  . Cancer Paternal Uncle        prostate cancer    Social History   Socioeconomic History  . Marital status: Legally Separated    Spouse name: Not on file  . Number of children: Not on file  . Years of education: Not on file  . Highest  education level: Not on file  Occupational History  . Not on file  Tobacco Use  . Smoking status: Never Smoker  . Smokeless tobacco: Never Used  Vaping Use  . Vaping Use: Never used  Substance and Sexual Activity  . Alcohol use: Yes    Alcohol/week: 1.0 standard drink    Types: 1 Cans of beer per week    Comment: a beer once a month  . Drug use: No  . Sexual activity: Not Currently    Birth control/protection: Post-menopausal  Other Topics Concern  . Not on file  Social History Narrative  . Not on file   Social Determinants of Health   Financial Resource Strain: Not on file  Food Insecurity: Not on file  Transportation Needs: Not on file  Physical Activity: Not on file  Stress: Not on file  Social Connections: Not on file  Intimate Partner Violence: Not on file    Outpatient Medications Prior to Visit  Medication Sig Dispense Refill  . cyclobenzaprine (FLEXERIL) 5 MG tablet Take 1 tablet (5 mg total) by mouth 2 (two) times daily as needed for muscle spasms. 20 tablet 0  . lisinopril (ZESTRIL) 10 MG tablet Take 1 tablet (10 mg total) by mouth daily. 30 tablet 4  . simvastatin (ZOCOR) 20 MG tablet Take 20 mg by  mouth daily.    Marland Kitchen triamcinolone cream (KENALOG) 0.1 % Apply 1 application topically 2 (two) times daily. 30 g 0  . Vitamin D, Ergocalciferol, (DRISDOL) 1.25 MG (50000 UNIT) CAPS capsule Take 1 capsule (50,000 Units total) by mouth every 7 (seven) days. 5 capsule 5  . ibuprofen (ADVIL,MOTRIN) 200 MG tablet Take 600 mg by mouth every 8 (eight) hours as needed.     No facility-administered medications prior to visit.    No Known Allergies  ROS Review of Systems  Constitutional: Negative for chills and fever.  HENT: Negative for congestion, sinus pressure, sinus pain and sore throat.   Eyes: Negative for pain and discharge.  Respiratory: Positive for cough and wheezing. Negative for shortness of breath.   Cardiovascular: Negative for chest pain and palpitations.   Gastrointestinal: Negative for abdominal pain, constipation, diarrhea, nausea and vomiting.  Endocrine: Negative for polydipsia and polyuria.  Genitourinary: Negative for dysuria and hematuria.  Musculoskeletal: Positive for back pain. Negative for neck pain and neck stiffness.  Skin: Negative for pallor.  Neurological: Negative for dizziness, seizures, syncope, weakness and headaches.  Psychiatric/Behavioral: Negative for agitation and behavioral problems.      Objective:    Physical Exam Vitals reviewed.  Constitutional:      General: She is not in acute distress.    Appearance: She is obese. She is not diaphoretic.  HENT:     Head: Normocephalic and atraumatic.     Nose: Nose normal.     Mouth/Throat:     Mouth: Mucous membranes are moist.  Eyes:     General: No scleral icterus.    Extraocular Movements: Extraocular movements intact.     Pupils: Pupils are equal, round, and reactive to light.  Cardiovascular:     Rate and Rhythm: Normal rate and regular rhythm.     Pulses: Normal pulses.     Heart sounds: Normal heart sounds. No murmur heard.   Pulmonary:     Breath sounds: Wheezing (diffuse b/l) present. No rales.  Abdominal:     Palpations: Abdomen is soft.     Tenderness: There is no abdominal tenderness.  Musculoskeletal:     Cervical back: Neck supple. No tenderness.     Right lower leg: No edema.     Left lower leg: No edema.  Skin:    General: Skin is warm.     Findings: No rash.  Neurological:     General: No focal deficit present.     Mental Status: She is alert and oriented to person, place, and time.     Sensory: No sensory deficit.     Motor: No weakness.  Psychiatric:        Mood and Affect: Mood normal.        Behavior: Behavior normal.     BP 136/86 (BP Location: Right Arm, Patient Position: Sitting, Cuff Size: Normal)   Pulse 90   Resp 18   Ht 5\' 5"  (1.651 m)   Wt 210 lb 1.9 oz (95.3 kg)   SpO2 97%   BMI 34.97 kg/m  Wt Readings from  Last 3 Encounters:  08/06/20 210 lb 1.9 oz (95.3 kg)  03/27/20 207 lb 6.4 oz (94.1 kg)  02/22/20 206 lb (93.4 kg)     Health Maintenance Due  Topic Date Due  . Hepatitis C Screening  Never done  . HIV Screening  Never done  . PAP SMEAR-Modifier  Never done  . COLONOSCOPY (Pts 45-24yrs Insurance coverage will need to  be confirmed)  Never done  . COLON CANCER SCREENING ANNUAL FOBT  09/29/2018  . MAMMOGRAM  05/18/2020    There are no preventive care reminders to display for this patient.  Lab Results  Component Value Date   TSH 0.675 04/21/2020   Lab Results  Component Value Date   WBC 6.0 04/21/2020   HGB 14.8 04/21/2020   HCT 46.1 04/21/2020   MCV 88 04/21/2020   PLT 254 04/21/2020   Lab Results  Component Value Date   NA 141 04/21/2020   K 4.5 04/21/2020   CO2 20 04/21/2020   GLUCOSE 87 04/21/2020   BUN 12 04/21/2020   CREATININE 0.91 04/21/2020   BILITOT 0.6 04/21/2020   ALKPHOS 104 04/21/2020   AST 17 04/21/2020   ALT 19 04/21/2020   PROT 7.2 04/21/2020   ALBUMIN 4.6 04/21/2020   CALCIUM 9.6 04/21/2020   ANIONGAP 9 11/27/2018   Lab Results  Component Value Date   CHOL 189 04/21/2020   Lab Results  Component Value Date   HDL 42 04/21/2020   Lab Results  Component Value Date   LDLCALC 117 (H) 04/21/2020   Lab Results  Component Value Date   TRIG 172 (H) 04/21/2020   Lab Results  Component Value Date   CHOLHDL 4.5 (H) 04/21/2020   Lab Results  Component Value Date   HGBA1C 5.6 06/15/2017      Assessment & Plan:   Problem List Items Addressed This Visit      Cardiovascular and Mediastinum   Essential hypertension - Primary    Well-controlled at home Continue Lisinopril 10 mg QD Counseled for compliance with the medications Advised DASH diet and moderate exercise/walking, at least 150 mins/week        Respiratory   Bronchitis   Relevant Medications   azithromycin (ZITHROMAX) 250 MG tablet   methylPREDNISolone (MEDROL DOSEPAK) 4  MG TBPK tablet   albuterol (VENTOLIN HFA) 108 (90 Base) MCG/ACT inhaler     Nervous and Auditory   Chronic low back pain with left-sided sciatica   Relevant Medications   methylPREDNISolone (MEDROL DOSEPAK) 4 MG TBPK tablet   meloxicam (MOBIC) 7.5 MG tablet    Other Visit Diagnoses    Seasonal allergies       Relevant Medications   levocetirizine (XYZAL) 5 MG tablet      Meds ordered this encounter  Medications  . azithromycin (ZITHROMAX) 250 MG tablet    Sig: Take as package instructions.    Dispense:  6 tablet    Refill:  0  . methylPREDNISolone (MEDROL DOSEPAK) 4 MG TBPK tablet    Sig: Take as package instructions.    Dispense:  1 each    Refill:  0  . albuterol (VENTOLIN HFA) 108 (90 Base) MCG/ACT inhaler    Sig: Inhale 2 puffs into the lungs every 6 (six) hours as needed for wheezing or shortness of breath.    Dispense:  8 g    Refill:  0  . meloxicam (MOBIC) 7.5 MG tablet    Sig: Take 1 tablet (7.5 mg total) by mouth daily.    Dispense:  30 tablet    Refill:  2  . levocetirizine (XYZAL) 5 MG tablet    Sig: Take 1 tablet (5 mg total) by mouth every evening.    Dispense:  30 tablet    Refill:  6    Follow-up: Return in about 4 months (around 12/06/2020) for HTN and HLD.    [place],  MD

## 2020-12-10 ENCOUNTER — Ambulatory Visit: Payer: Medicare HMO | Admitting: Internal Medicine

## 2021-01-08 ENCOUNTER — Telehealth (INDEPENDENT_AMBULATORY_CARE_PROVIDER_SITE_OTHER): Payer: Medicare HMO | Admitting: Internal Medicine

## 2021-01-08 ENCOUNTER — Other Ambulatory Visit: Payer: Self-pay

## 2021-01-08 ENCOUNTER — Encounter: Payer: Self-pay | Admitting: Internal Medicine

## 2021-01-08 DIAGNOSIS — E785 Hyperlipidemia, unspecified: Secondary | ICD-10-CM

## 2021-01-08 DIAGNOSIS — Z0000 Encounter for general adult medical examination without abnormal findings: Secondary | ICD-10-CM

## 2021-01-08 DIAGNOSIS — E559 Vitamin D deficiency, unspecified: Secondary | ICD-10-CM

## 2021-01-08 DIAGNOSIS — K219 Gastro-esophageal reflux disease without esophagitis: Secondary | ICD-10-CM | POA: Diagnosis not present

## 2021-01-08 DIAGNOSIS — Z114 Encounter for screening for human immunodeficiency virus [HIV]: Secondary | ICD-10-CM | POA: Diagnosis not present

## 2021-01-08 DIAGNOSIS — I10 Essential (primary) hypertension: Secondary | ICD-10-CM | POA: Diagnosis not present

## 2021-01-08 DIAGNOSIS — Z1159 Encounter for screening for other viral diseases: Secondary | ICD-10-CM | POA: Diagnosis not present

## 2021-01-08 DIAGNOSIS — Z0001 Encounter for general adult medical examination with abnormal findings: Secondary | ICD-10-CM

## 2021-01-08 MED ORDER — LISINOPRIL 10 MG PO TABS: 10.0000 mg | ORAL_TABLET | Freq: Every day | ORAL | 4 refills | Status: DC

## 2021-01-08 MED ORDER — SIMVASTATIN 20 MG PO TABS: 20.0000 mg | ORAL_TABLET | Freq: Every day | ORAL | 5 refills | Status: DC

## 2021-01-08 MED ORDER — OMEPRAZOLE 20 MG PO CPDR: 20.0000 mg | DELAYED_RELEASE_CAPSULE | Freq: Every day | ORAL | 3 refills | Status: DC

## 2021-01-08 NOTE — Progress Notes (Addendum)
Virtual Visit via Telephone Note   This visit type was conducted due to national recommendations for restrictions regarding the COVID-19 Pandemic (e.g. social distancing) in an effort to limit this patient's exposure and mitigate transmission in our community.  Due to her co-morbid illnesses, this patient is at least at moderate risk for complications without adequate follow up.  This format is felt to be most appropriate for this patient at this time.  The patient did not have access to video technology/had technical difficulties with video requiring transitioning to audio format only (telephone).  All issues noted in this document were discussed and addressed.  No physical exam could be performed with this format.  Evaluation Performed:  Follow-up visit  Date:  01/08/2021   ID:  Elizabeth Moore, DOB February 14, 1966, MRN [id number]  Patient Location: Home Provider Location: Office/Clinic  Participants: Patient Location of Patient: Home Location of Provider: Telehealth Consent was obtain for visit to be over via telehealth. I verified that I am speaking with the correct person using two identifiers.  PCP:  Elizabeth Spar, MD   Chief Complaint:  Follow up of her chronic medical conditions  History of Present Illness:    Elizabeth Moore is a 55 y.o. female with past medical history of hypertension, hyperlipidemia, chronic knee arthritis, chronic low back pain with sciatica and obesity who has a televisit for follow up of her chronic medical conditions.   Patient states that she has been doing well overall. BP is well-controlled at home. Takes medications regularly. Patient denies headache, dizziness, chest pain, dyspnea or palpitations.  She has not been taking statin regularly, but states that she will take it regularly now.  She c/o acid reflux. Denies any dysphagia or odynophagia. Denies any nausea or vomiting.  The patient does not have symptoms concerning for COVID-19  infection (fever, chills, cough, or new shortness of breath).   Past Medical, Surgical, Social History, Allergies, and Medications have been Reviewed.  Past Medical History:  Diagnosis Date   Arthritis    Cervical cancer [facility]) age 26   GERD (gastroesophageal reflux disease)    Hypertension    Phreesia 03/26/2020   Past Surgical History:  Procedure Laterality Date   CESAREAN SECTION       Current Meds  Medication Sig   albuterol (VENTOLIN HFA) 108 (90 Base) MCG/ACT inhaler Inhale 2 puffs into the lungs every 6 (six) hours as needed for wheezing or shortness of breath.   cyclobenzaprine (FLEXERIL) 5 MG tablet Take 1 tablet (5 mg total) by mouth 2 (two) times daily as needed for muscle spasms.   levocetirizine (XYZAL) 5 MG tablet Take 1 tablet (5 mg total) by mouth every evening.   meloxicam (MOBIC) 7.5 MG tablet Take 1 tablet (7.5 mg total) by mouth daily.   omeprazole (PRILOSEC) 20 MG capsule Take 1 capsule (20 mg total) by mouth daily.   triamcinolone cream (KENALOG) 0.1 % Apply 1 application topically 2 (two) times daily.   Vitamin D, Ergocalciferol, (DRISDOL) 1.25 MG (50000 UNIT) CAPS capsule Take 1 capsule (50,000 Units total) by mouth every 7 (seven) days.   [DISCONTINUED] azithromycin (ZITHROMAX) 250 MG tablet Take as package instructions.   [DISCONTINUED] lisinopril (ZESTRIL) 10 MG tablet Take 1 tablet (10 mg total) by mouth daily.   [DISCONTINUED] methylPREDNISolone (MEDROL DOSEPAK) 4 MG TBPK tablet Take as package instructions.   [DISCONTINUED] simvastatin (ZOCOR) 20 MG tablet Take 20 mg by mouth daily.     Allergies:  Patient has no known allergies.   ROS:   Please see the history of present illness.     All other systems reviewed and are negative.   Labs/Other Tests and Data Reviewed:    Recent Labs: 04/21/2020: ALT 19; BUN 12; Creatinine, Ser 0.91; Hemoglobin 14.8; Platelets 254; Potassium 4.5; Sodium 141; TSH 0.675   Recent Lipid Panel Lab Results  Component  Value Date/Time   CHOL 189 04/21/2020 11:17 AM   TRIG 172 (H) 04/21/2020 11:17 AM   HDL 42 04/21/2020 11:17 AM   CHOLHDL 4.5 (H) 04/21/2020 11:17 AM   CHOLHDL 4.4 11/27/2018 11:37 AM   LDLCALC 117 (H) 04/21/2020 11:17 AM    Wt Readings from Last 3 Encounters:  08/06/20 210 lb 1.9 oz (95.3 kg)  03/27/20 207 lb 6.4 oz (94.1 kg)  02/22/20 206 lb (93.4 kg)     ASSESSMENT & PLAN:    Essential hypertension Well-controlled at home Continue Lisinopril 10 mg QD Counseled for compliance with the medications Advised DASH diet and moderate exercise/walking, at least 150 mins/week  GERD (gastroesophageal reflux disease) Reports acid reflux Started Omeprazole 20 mg QD  Hyperlipidemia Noncompliant with statin Advised to take Simvastatin regularly  Vitamin D deficiency Last vitamin D Lab Results  Component Value Date   VD25OH 18.8 (L) 04/21/2020   Had started Vitamin D 50,000 IU every week She has been taking OTC supplements, will check levels in the next visit   Time:   Today, I have spent 13 minutes reviewing the chart, including problem list, medications, and with the patient with telehealth technology discussing the above problems.   Medication Adjustments/Labs and Tests Ordered: Current medicines are reviewed at length with the patient today.  Concerns regarding medicines are outlined above.   Tests Ordered: Orders Placed This Encounter  Procedures   CBC with Differential/Platelet   CMP14+EGFR   Lipid panel   HgB A1c   TSH   Vitamin D (25 hydroxy)   HIV antibody (with reflex)   Hepatitis C Antibody    Medication Changes: Meds ordered this encounter  Medications   lisinopril (ZESTRIL) 10 MG tablet    Sig: Take 1 tablet (10 mg total) by mouth daily.    Dispense:  30 tablet    Refill:  4   simvastatin (ZOCOR) 20 MG tablet    Sig: Take 1 tablet (20 mg total) by mouth daily.    Dispense:  30 tablet    Refill:  5   omeprazole (PRILOSEC) 20 MG capsule    Sig:  Take 1 capsule (20 mg total) by mouth daily.    Dispense:  30 capsule    Refill:  3     Note: This dictation was prepared with Dragon dictation along with smaller phrase technology. Similar sounding words can be transcribed inadequately or may not be corrected upon review. Any transcriptional errors that result from this process are unintentional.      Disposition:  Follow up  Signed, Elizabeth Spar, MD  01/08/2021 11:28 AM     [place]

## 2021-01-08 NOTE — Assessment & Plan Note (Signed)
Well-controlled at home Continue Lisinopril 10 mg QD Counseled for compliance with the medications Advised DASH diet and moderate exercise/walking, at least 150 mins/week

## 2021-01-08 NOTE — Assessment & Plan Note (Signed)
Reports acid reflux Started Omeprazole 20 mg QD

## 2021-01-08 NOTE — Patient Instructions (Addendum)
Please start taking Omeprazole for acid reflux.  Please avoid hot and spicy food.  Please continue to take medications as prescribed.  Continue to follow low salt diet and perform moderate exercise/walking at least 150 mins/week.  Please get fasting blood tests done before the next visit.

## 2021-01-08 NOTE — Assessment & Plan Note (Signed)
Last vitamin D Lab Results  Component Value Date   VD25OH 18.8 (L) 04/21/2020   Had started Vitamin D 50,000 IU every week She has been taking OTC supplements, will check levels in the next visit

## 2021-01-08 NOTE — Assessment & Plan Note (Signed)
Noncompliant with statin Advised to take Simvastatin regularly

## 2021-01-24 ENCOUNTER — Ambulatory Visit: Payer: Self-pay

## 2021-01-25 ENCOUNTER — Ambulatory Visit (INDEPENDENT_AMBULATORY_CARE_PROVIDER_SITE_OTHER): Payer: Medicare HMO | Admitting: *Deleted

## 2021-01-25 ENCOUNTER — Other Ambulatory Visit: Payer: Self-pay

## 2021-01-25 DIAGNOSIS — Z1231 Encounter for screening mammogram for malignant neoplasm of breast: Secondary | ICD-10-CM

## 2021-01-25 DIAGNOSIS — Z0000 Encounter for general adult medical examination without abnormal findings: Secondary | ICD-10-CM | POA: Diagnosis not present

## 2021-01-25 NOTE — Patient Instructions (Signed)
Elizabeth Moore , Thank you for taking time to come for your Medicare Wellness Visit. I appreciate your ongoing commitment to your health goals. Please review the following plan we discussed and let me know if I can assist you in the future.   Screening recommendations/referrals: Colonoscopy: Due now will discuss with provider Mammogram: Order placed Recommended yearly ophthalmology/optometry visit for glaucoma screening and checkup Recommended yearly dental visit for hygiene and checkup  Vaccinations: Influenza vaccine: Due now  Pneumococcal vaccine: Due now Tdap vaccine: Completed Shingles vaccine: Due now    Advanced directives: Information provided  Conditions/risks identified: Hypertension  Next appointment: 1 year  Preventive Care 40-64 Years, Female Preventive care refers to lifestyle choices and visits with your health care provider that can promote health and wellness. What does preventive care include? A yearly physical exam. This is also called an annual well check. Dental exams once or twice a year. Routine eye exams. Ask your health care provider how often you should have your eyes checked. Personal lifestyle choices, including: Daily care of your teeth and gums. Regular physical activity. Eating a healthy diet. Avoiding tobacco and drug use. Limiting alcohol use. Practicing safe sex. Taking low-dose aspirin daily starting at age 64. Taking vitamin and mineral supplements as recommended by your health care provider. What happens during an annual well check? The services and screenings done by your health care provider during your annual well check will depend on your age, overall health, lifestyle risk factors, and family history of disease. Counseling  Your health care provider may ask you questions about your: Alcohol use. Tobacco use. Drug use. Emotional well-being. Home and relationship well-being. Sexual activity. Eating habits. Work and work  Statistician. Method of birth control. Menstrual cycle. Pregnancy history. Screening  You may have the following tests or measurements: Height, weight, and BMI. Blood pressure. Lipid and cholesterol levels. These may be checked every 5 years, or more frequently if you are over 55 years old. Skin check. Lung cancer screening. You may have this screening every year starting at age 72 if you have a 30-pack-year history of smoking and currently smoke or have quit within the past 15 years. Fecal occult blood test (FOBT) of the stool. You may have this test every year starting at age 82. Flexible sigmoidoscopy or colonoscopy. You may have a sigmoidoscopy every 5 years or a colonoscopy every 10 years starting at age 32. Hepatitis C blood test. Hepatitis B blood test. Sexually transmitted disease (STD) testing. Diabetes screening. This is done by checking your blood sugar (glucose) after you have not eaten for a while (fasting). You may have this done every 1-3 years. Mammogram. This may be done every 1-2 years. Talk to your health care provider about when you should start having regular mammograms. This may depend on whether you have a family history of breast cancer. BRCA-related cancer screening. This may be done if you have a family history of breast, ovarian, tubal, or peritoneal cancers. Pelvic exam and Pap test. This may be done every 3 years starting at age 82. Starting at age 49, this may be done every 5 years if you have a Pap test in combination with an HPV test. Bone density scan. This is done to screen for osteoporosis. You may have this scan if you are at high risk for osteoporosis. Discuss your test results, treatment options, and if necessary, the need for more tests with your health care provider. Vaccines  Your health care provider may recommend certain vaccines,  such as: Influenza vaccine. This is recommended every year. Tetanus, diphtheria, and acellular pertussis (Tdap, Td)  vaccine. You may need a Td booster every 10 years. Zoster vaccine. You may need this after age 58. Pneumococcal 13-valent conjugate (PCV13) vaccine. You may need this if you have certain conditions and were not previously vaccinated. Pneumococcal polysaccharide (PPSV23) vaccine. You may need one or two doses if you smoke cigarettes or if you have certain conditions. Talk to your health care provider about which screenings and vaccines you need and how often you need them. This information is not intended to replace advice given to you by your health care provider. Make sure you discuss any questions you have with your health care provider. Document Released: 05/30/2015 Document Revised: 01/21/2016 Document Reviewed: 03/04/2015 Elsevier Interactive Patient Education  2017 [employer] Prevention in the Home Falls can cause injuries. They can happen to people of all ages. There are many things you can do to make your home safe and to help prevent falls. What can I do on the outside of my home? Regularly fix the edges of walkways and driveways and fix any cracks. Remove anything that might make you trip as you walk through a door, such as a raised step or threshold. Trim any bushes or trees on the path to your home. Use bright outdoor lighting. Clear any walking paths of anything that might make someone trip, such as rocks or tools. Regularly check to see if handrails are loose or broken. Make sure that both sides of any steps have handrails. Any raised decks and porches should have guardrails on the edges. Have any leaves, snow, or ice cleared regularly. Use sand or salt on walking paths during winter. Clean up any spills in your garage right away. This includes oil or grease spills. What can I do in the bathroom? Use night lights. Install grab bars by the toilet and in the tub and shower. Do not use towel bars as grab bars. Use non-skid mats or decals in the tub or shower. If you  need to sit down in the shower, use a plastic, non-slip stool. Keep the floor dry. Clean up any water that spills on the floor as soon as it happens. Remove soap buildup in the tub or shower regularly. Attach bath mats securely with double-sided non-slip rug tape. Do not have throw rugs and other things on the floor that can make you trip. What can I do in the bedroom? Use night lights. Make sure that you have a light by your bed that is easy to reach. Do not use any sheets or blankets that are too big for your bed. They should not hang down onto the floor. Have a firm chair that has side arms. You can use this for support while you get dressed. Do not have throw rugs and other things on the floor that can make you trip. What can I do in the kitchen? Clean up any spills right away. Avoid walking on wet floors. Keep items that you use a lot in easy-to-reach places. If you need to reach something above you, use a strong step stool that has a grab bar. Keep electrical cords out of the way. Do not use floor polish or wax that makes floors slippery. If you must use wax, use non-skid floor wax. Do not have throw rugs and other things on the floor that can make you trip. What can I do with my stairs? Do not  leave any items on the stairs. Make sure that there are handrails on both sides of the stairs and use them. Fix handrails that are broken or loose. Make sure that handrails are as long as the stairways. Check any carpeting to make sure that it is firmly attached to the stairs. Fix any carpet that is loose or worn. Avoid having throw rugs at the top or bottom of the stairs. If you do have throw rugs, attach them to the floor with carpet tape. Make sure that you have a light switch at the top of the stairs and the bottom of the stairs. If you do not have them, ask someone to add them for you. What else can I do to help prevent falls? Wear shoes that: Do not have high heels. Have rubber  bottoms. Are comfortable and fit you well. Are closed at the toe. Do not wear sandals. If you use a stepladder: Make sure that it is fully opened. Do not climb a closed stepladder. Make sure that both sides of the stepladder are locked into place. Ask someone to hold it for you, if possible. Clearly mark and make sure that you can see: Any grab bars or handrails. First and last steps. Where the edge of each step is. Use tools that help you move around (mobility aids) if they are needed. These include: Canes. Walkers. Scooters. Crutches. Turn on the lights when you go into a dark area. Replace any light bulbs as soon as they burn out. Set up your furniture so you have a clear path. Avoid moving your furniture around. If any of your floors are uneven, fix them. If there are any pets around you, be aware of where they are. Review your medicines with your doctor. Some medicines can make you feel dizzy. This can increase your chance of falling. Ask your doctor what other things that you can do to help prevent falls. This information is not intended to replace advice given to you by your health care provider. Make sure you discuss any questions you have with your health care provider. Document Released: 02/27/2009 Document Revised: 10/09/2015 Document Reviewed: 06/07/2014 Elsevier Interactive Patient Education  2017 [employer].

## 2021-01-25 NOTE — Progress Notes (Signed)
Subjective:   Elizabeth Moore is a 55 y.o. female who presents for Medicare Annual (Subsequent) preventive examination.  I connected with  [name] on 01/25/21 by an audio enabled telemedicine application and verified that I am speaking with the correct person using two identifiers.   I discussed the limitations, risks, security and privacy concerns of performing an evaluation and management service by telephone and the availability of in person appointments. I also discussed with the patient that there may be a patient responsible charge related to this service. The patient expressed understanding and verbally consented to this telephonic visit.  Review of Systems           Objective:    There were no vitals filed for this visit. There is no height or weight on file to calculate BMI.  Advanced Directives 02/20/2020 09/28/2017 07/13/2017  Does Patient Have a Medical Advance Directive? No No No  Would patient like information on creating a medical advance directive? No - Patient declined No - Patient declined No - Patient declined    Current Medications (verified) Outpatient Encounter Medications as of 01/25/2021  Medication Sig   albuterol (VENTOLIN HFA) 108 (90 Base) MCG/ACT inhaler Inhale 2 puffs into the lungs every 6 (six) hours as needed for wheezing or shortness of breath.   cyclobenzaprine (FLEXERIL) 5 MG tablet Take 1 tablet (5 mg total) by mouth 2 (two) times daily as needed for muscle spasms.   levocetirizine (XYZAL) 5 MG tablet Take 1 tablet (5 mg total) by mouth every evening.   lisinopril (ZESTRIL) 10 MG tablet Take 1 tablet (10 mg total) by mouth daily.   meloxicam (MOBIC) 7.5 MG tablet Take 1 tablet (7.5 mg total) by mouth daily.   omeprazole (PRILOSEC) 20 MG capsule Take 1 capsule (20 mg total) by mouth daily.   simvastatin (ZOCOR) 20 MG tablet Take 1 tablet (20 mg total) by mouth daily.   triamcinolone cream (KENALOG) 0.1 % Apply 1 application topically  2 (two) times daily.   Vitamin D, Ergocalciferol, (DRISDOL) 1.25 MG (50000 UNIT) CAPS capsule Take 1 capsule (50,000 Units total) by mouth every 7 (seven) days.   No facility-administered encounter medications on file as of 01/25/2021.    Allergies (verified) Patient has no known allergies.   History: Past Medical History:  Diagnosis Date   Arthritis    Cervical cancer ([place]) age 19   GERD (gastroesophageal reflux disease)    Hypertension    [employer]reesia 03/26/2020   Past Surgical History:  Procedure Laterality Date   CESAREAN SECTION     Family History  Problem Relation Age of Onset   Heart disease Mother    Cancer Father        lung   Heart disease Maternal Aunt    Cancer Paternal Uncle        Lung   Cancer Paternal Uncle        prostate cancer   Social History   Socioeconomic History   Marital status: Legally Separated    Spouse name: Not on file   Number of children: Not on file   Years of education: Not on file   Highest education level: Not on file  Occupational History   Not on file  Tobacco Use   Smoking status: Never   Smokeless tobacco: Never  Vaping Use   Vaping Use: Never used  Substance and Sexual Activity   Alcohol use: Yes    Alcohol/week: 1.0 standard drink    Types:  1 Cans of beer per week    Comment: a beer once a month   Drug use: No   Sexual activity: Not Currently    Birth control/protection: Post-menopausal  Other Topics Concern   Not on file  Social History Narrative   Not on file   Social Determinants of Health   Financial Resource Strain: Not on file  Food Insecurity: Not on file  Transportation Needs: Not on file  Physical Activity: Not on file  Stress: Not on file  Social Connections: Not on file    Tobacco Counseling Counseling given: Not Answered   Clinical Intake:                 Diabetic?no         Activities of Daily Living In your present state of health, do you have any difficulty performing the  following activities: 03/27/2020  Hearing? N  Vision? N  Difficulty concentrating or making decisions? N  Walking or climbing stairs? Y  Dressing or bathing? N  Doing errands, shopping? N  Some recent data might be hidden    Patient Care Team: Lindell Spar, MD as PCP - General (Internal Medicine)  Indicate any recent Medical Services you may have received from other than [facility] providers in the past year (date may be approximate).     Assessment:   This is a routine wellness examination for Elizabeth Moore.  Hearing/Vision screen No results found.  Dietary issues and exercise activities discussed:     Goals Addressed   None   Depression Screen PHQ 2/9 Scores 01/08/2021 08/06/2020 05/08/2020 03/27/2020 06/27/2017  PHQ - 2 Score 1 0 0 3 0  PHQ- 9 Score - - - 9 -    Fall Risk Fall Risk  01/08/2021 08/06/2020 05/08/2020 03/27/2020  Falls in the past year? 0 0 0 0  Number falls in past yr: 0 0 0 0  Injury with Fall? 0 0 0 0  Risk for fall due to : No Fall Risks No Fall Risks No Fall Risks No Fall Risks  Follow up Falls evaluation completed Falls evaluation completed Falls evaluation completed Falls evaluation completed    [place]:  Any stairs in or around the home? No  If so, are there any without handrails? No  Home free of loose throw rugs in walkways, pet beds, electrical cords, etc? Yes  Adequate lighting in your home to reduce risk of falls? Yes   ASSISTIVE DEVICES UTILIZED TO PREVENT FALLS:  Life alert? No  Use of a cane, walker or w/c? Yes  Grab bars in the bathroom? Yes  Shower chair or bench in shower? No  Elevated toilet seat or a handicapped toilet? No   TIMED UP AND GO:  Was the test performed? No .  Length of time to ambulate 10 feet: NA sec.     Cognitive Function:        Immunizations Immunization History  Administered Date(s) Administered   Influenza Inj Mdck Quad Pf 03/18/2018   Influenza,inj,Quad PF,6+ Mos  03/27/2020   Influenza-Unspecified 03/02/2019   Tdap 09/19/2005, 03/26/2016    TDAP status: Up to date  Flu Vaccine status: Due, Education has been provided regarding the importance of this vaccine. Advised may receive this vaccine at local pharmacy or Health Dept. Aware to provide a copy of the vaccination record if obtained from local pharmacy or Health Dept. Verbalized acceptance and understanding.  Pneumococcal vaccine status: Due, Education has been provided  regarding the importance of this vaccine. Advised may receive this vaccine at local pharmacy or Health Dept. Aware to provide a copy of the vaccination record if obtained from local pharmacy or Health Dept. Verbalized acceptance and understanding.  Covid-19 vaccine status: Declined, Education has been provided regarding the importance of this vaccine but patient still declined. Advised may receive this vaccine at local pharmacy or Health Dept.or vaccine clinic. Aware to provide a copy of the vaccination record if obtained from local pharmacy or Health Dept. Verbalized acceptance and understanding.  Qualifies for Shingles Vaccine? Yes   Zostavax completed No   Shingrix Completed?: No.    Education has been provided regarding the importance of this vaccine. Patient has been advised to call insurance company to determine out of pocket expense if they have not yet received this vaccine. Advised may also receive vaccine at local pharmacy or Health Dept. Verbalized acceptance and understanding.  Screening Tests Health Maintenance  Topic Date Due   COVID-19 Vaccine (1) Never done   Hepatitis C Screening  Never done   PAP SMEAR-Modifier  Never done   COLONOSCOPY (Pts 45-3yr Insurance coverage will need to be confirmed)  Never done   Zoster Vaccines- Shingrix (1 of 2) Never done   COLON CANCER SCREENING ANNUAL FOBT  09/29/2018   MAMMOGRAM  05/18/2020   INFLUENZA VACCINE  12/15/2020   TETANUS/TDAP  03/26/2026   HIV Screening  Completed    Pneumococcal Vaccine [id number] old  Aged Out   HPV VDelmitaMaintenance Due  Topic Date Due   COVID-19 Vaccine (1) Never done   Hepatitis C Screening  Never done   PAP SMEAR-Modifier  Never done   COLONOSCOPY (Pts 45-412yrInsurance coverage will need to be confirmed)  Never done   Zoster Vaccines- Shingrix (1 of 2) Never done   COLON CANCER SCREENING ANNUAL FOBT  09/29/2018   MAMMOGRAM  05/18/2020   INFLUENZA VACCINE  12/15/2020    Colorectal cancer screening: Type of screening: FOBT/FIT. Completed 09-28-17. Repeat every 1 years  Mammogram status: Completed 05-18-18. Repeat every year    Lung Cancer Screening: (Low Dose CT Chest recommended if Age 55-80ears, 30 pack-year currently smoking OR have quit w/in 15years.) does not qualify.   Lung Cancer Screening Referral: NA  Additional Screening:  Hepatitis C Screening: does qualify; Completed   Vision Screening: Recommended annual ophthalmology exams for early detection of glaucoma and other disorders of the eye. Is the patient up to date with their annual eye exam?  No  Who is the provider or what is the name of the office in which the patient attends annual eye exams? [facility]  [facility]oss[facility] pt is not established with a provider, would they like to be referred to a provider to establish care? No .   Dental Screening: Recommended annual dental exams for proper oral hygiene  Community Resource Referral / Chronic Care Management: CRR required this visit?  No   CCM required this visit?  No      Plan:     I have personally reviewed and noted the following in the patient's chart:   Medical and social history Use of alcohol, tobacco or illicit drugs  Current medications and supplements including opioid prescriptions.  Functional ability and status Nutritional status Physical activity Advanced directives List of other physicians Hospitalizations, surgeries, and ER visits in  previous 12 months Vitals Screenings to include cognitive, depression, and falls  Referrals and appointments  In addition, I have reviewed and discussed with patient certain preventive protocols, quality metrics, and best practice recommendations. A written personalized care plan for preventive services as well as general preventive health recommendations were provided to patient.     Shelda Altes, CMA   01/25/2021   Nurse Notes: This was a telehalth visit. The Patient was at home. The provider was at home and was Ihor Dow, MD.

## 2021-03-10 DIAGNOSIS — H5200 Hypermetropia, unspecified eye: Secondary | ICD-10-CM | POA: Diagnosis not present

## 2021-03-10 DIAGNOSIS — I10 Essential (primary) hypertension: Secondary | ICD-10-CM | POA: Diagnosis not present

## 2021-03-10 DIAGNOSIS — E7800 Pure hypercholesterolemia, unspecified: Secondary | ICD-10-CM | POA: Diagnosis not present

## 2021-03-10 DIAGNOSIS — Z0100 Encounter for examination of eyes and vision without abnormal findings: Secondary | ICD-10-CM | POA: Diagnosis not present

## 2021-05-05 ENCOUNTER — Other Ambulatory Visit: Payer: Self-pay | Admitting: *Deleted

## 2021-05-05 DIAGNOSIS — K219 Gastro-esophageal reflux disease without esophagitis: Secondary | ICD-10-CM

## 2021-05-05 MED ORDER — OMEPRAZOLE 20 MG PO CPDR
20.0000 mg | DELAYED_RELEASE_CAPSULE | Freq: Every day | ORAL | 3 refills | Status: DC
Start: 2021-05-05 — End: 2021-10-06

## 2021-07-07 ENCOUNTER — Other Ambulatory Visit: Payer: Self-pay | Admitting: Internal Medicine

## 2021-07-07 DIAGNOSIS — I10 Essential (primary) hypertension: Secondary | ICD-10-CM

## 2021-07-07 DIAGNOSIS — E785 Hyperlipidemia, unspecified: Secondary | ICD-10-CM

## 2021-08-05 DIAGNOSIS — Z6838 Body mass index (BMI) 38.0-38.9, adult: Secondary | ICD-10-CM | POA: Diagnosis not present

## 2021-08-05 DIAGNOSIS — K219 Gastro-esophageal reflux disease without esophagitis: Secondary | ICD-10-CM | POA: Diagnosis not present

## 2021-08-05 DIAGNOSIS — J209 Acute bronchitis, unspecified: Secondary | ICD-10-CM | POA: Diagnosis not present

## 2021-08-05 DIAGNOSIS — R062 Wheezing: Secondary | ICD-10-CM | POA: Diagnosis not present

## 2021-08-05 DIAGNOSIS — J329 Chronic sinusitis, unspecified: Secondary | ICD-10-CM | POA: Diagnosis not present

## 2021-08-05 DIAGNOSIS — Z20828 Contact with and (suspected) exposure to other viral communicable diseases: Secondary | ICD-10-CM | POA: Diagnosis not present

## 2021-08-05 DIAGNOSIS — I10 Essential (primary) hypertension: Secondary | ICD-10-CM | POA: Diagnosis not present

## 2021-09-15 DIAGNOSIS — H6590 Unspecified nonsuppurative otitis media, unspecified ear: Secondary | ICD-10-CM | POA: Diagnosis not present

## 2021-09-15 DIAGNOSIS — I10 Essential (primary) hypertension: Secondary | ICD-10-CM | POA: Diagnosis not present

## 2021-09-15 DIAGNOSIS — F17210 Nicotine dependence, cigarettes, uncomplicated: Secondary | ICD-10-CM | POA: Diagnosis not present

## 2021-09-15 DIAGNOSIS — H6980 Other specified disorders of Eustachian tube, unspecified ear: Secondary | ICD-10-CM | POA: Diagnosis not present

## 2021-09-15 DIAGNOSIS — J029 Acute pharyngitis, unspecified: Secondary | ICD-10-CM | POA: Diagnosis not present

## 2021-10-05 ENCOUNTER — Other Ambulatory Visit: Payer: Self-pay | Admitting: Internal Medicine

## 2021-10-05 DIAGNOSIS — K219 Gastro-esophageal reflux disease without esophagitis: Secondary | ICD-10-CM

## 2021-12-03 ENCOUNTER — Other Ambulatory Visit: Payer: Self-pay | Admitting: Internal Medicine

## 2021-12-03 DIAGNOSIS — I10 Essential (primary) hypertension: Secondary | ICD-10-CM

## 2022-02-22 ENCOUNTER — Other Ambulatory Visit: Payer: Self-pay | Admitting: Internal Medicine

## 2022-02-22 DIAGNOSIS — E785 Hyperlipidemia, unspecified: Secondary | ICD-10-CM

## 2022-02-22 DIAGNOSIS — K219 Gastro-esophageal reflux disease without esophagitis: Secondary | ICD-10-CM

## 2022-02-24 DIAGNOSIS — N904 Leukoplakia of vulva: Secondary | ICD-10-CM | POA: Diagnosis not present

## 2022-02-24 DIAGNOSIS — E669 Obesity, unspecified: Secondary | ICD-10-CM | POA: Diagnosis not present

## 2022-02-24 DIAGNOSIS — Z6838 Body mass index (BMI) 38.0-38.9, adult: Secondary | ICD-10-CM | POA: Diagnosis not present

## 2022-02-24 DIAGNOSIS — Z23 Encounter for immunization: Secondary | ICD-10-CM | POA: Diagnosis not present

## 2022-02-24 DIAGNOSIS — R030 Elevated blood-pressure reading, without diagnosis of hypertension: Secondary | ICD-10-CM | POA: Diagnosis not present

## 2022-02-24 DIAGNOSIS — M62838 Other muscle spasm: Secondary | ICD-10-CM | POA: Diagnosis not present

## 2022-02-24 DIAGNOSIS — M542 Cervicalgia: Secondary | ICD-10-CM | POA: Diagnosis not present

## 2022-07-01 ENCOUNTER — Other Ambulatory Visit: Payer: Self-pay | Admitting: Internal Medicine

## 2022-07-01 DIAGNOSIS — K219 Gastro-esophageal reflux disease without esophagitis: Secondary | ICD-10-CM

## 2022-07-08 ENCOUNTER — Other Ambulatory Visit: Payer: Self-pay | Admitting: Internal Medicine

## 2022-07-08 DIAGNOSIS — K219 Gastro-esophageal reflux disease without esophagitis: Secondary | ICD-10-CM

## 2022-07-15 ENCOUNTER — Encounter: Payer: Self-pay | Admitting: Radiology

## 2022-07-23 DIAGNOSIS — K219 Gastro-esophageal reflux disease without esophagitis: Secondary | ICD-10-CM | POA: Diagnosis not present

## 2022-07-23 DIAGNOSIS — Z0000 Encounter for general adult medical examination without abnormal findings: Secondary | ICD-10-CM | POA: Diagnosis not present

## 2022-07-23 DIAGNOSIS — Z1329 Encounter for screening for other suspected endocrine disorder: Secondary | ICD-10-CM | POA: Diagnosis not present

## 2022-07-23 DIAGNOSIS — Z13220 Encounter for screening for lipoid disorders: Secondary | ICD-10-CM | POA: Diagnosis not present

## 2022-07-23 DIAGNOSIS — I10 Essential (primary) hypertension: Secondary | ICD-10-CM | POA: Diagnosis not present

## 2022-07-30 DIAGNOSIS — Z1331 Encounter for screening for depression: Secondary | ICD-10-CM | POA: Diagnosis not present

## 2022-07-30 DIAGNOSIS — K219 Gastro-esophageal reflux disease without esophagitis: Secondary | ICD-10-CM | POA: Diagnosis not present

## 2022-07-30 DIAGNOSIS — I10 Essential (primary) hypertension: Secondary | ICD-10-CM | POA: Diagnosis not present

## 2022-07-30 DIAGNOSIS — Z1389 Encounter for screening for other disorder: Secondary | ICD-10-CM | POA: Diagnosis not present

## 2022-07-30 DIAGNOSIS — N904 Leukoplakia of vulva: Secondary | ICD-10-CM | POA: Diagnosis not present

## 2022-07-30 DIAGNOSIS — E669 Obesity, unspecified: Secondary | ICD-10-CM | POA: Diagnosis not present

## 2022-07-30 DIAGNOSIS — E781 Pure hyperglyceridemia: Secondary | ICD-10-CM | POA: Diagnosis not present

## 2022-07-30 DIAGNOSIS — Z6838 Body mass index (BMI) 38.0-38.9, adult: Secondary | ICD-10-CM | POA: Diagnosis not present

## 2022-07-30 DIAGNOSIS — Z0001 Encounter for general adult medical examination with abnormal findings: Secondary | ICD-10-CM | POA: Diagnosis not present

## 2022-08-03 DIAGNOSIS — H524 Presbyopia: Secondary | ICD-10-CM | POA: Diagnosis not present

## 2022-08-03 DIAGNOSIS — H52223 Regular astigmatism, bilateral: Secondary | ICD-10-CM | POA: Diagnosis not present

## 2022-08-03 DIAGNOSIS — H5203 Hypermetropia, bilateral: Secondary | ICD-10-CM | POA: Diagnosis not present

## 2022-08-23 DIAGNOSIS — Z1212 Encounter for screening for malignant neoplasm of rectum: Secondary | ICD-10-CM | POA: Diagnosis not present

## 2022-08-23 DIAGNOSIS — Z1211 Encounter for screening for malignant neoplasm of colon: Secondary | ICD-10-CM | POA: Diagnosis not present

## 2022-08-30 ENCOUNTER — Other Ambulatory Visit: Payer: Self-pay | Admitting: Internal Medicine

## 2022-08-30 DIAGNOSIS — E785 Hyperlipidemia, unspecified: Secondary | ICD-10-CM

## 2022-09-09 ENCOUNTER — Other Ambulatory Visit: Payer: Self-pay | Admitting: Internal Medicine

## 2022-09-09 DIAGNOSIS — E785 Hyperlipidemia, unspecified: Secondary | ICD-10-CM

## 2023-01-22 DIAGNOSIS — R030 Elevated blood-pressure reading, without diagnosis of hypertension: Secondary | ICD-10-CM | POA: Diagnosis not present

## 2023-01-22 DIAGNOSIS — Z6839 Body mass index (BMI) 39.0-39.9, adult: Secondary | ICD-10-CM | POA: Diagnosis not present

## 2023-01-22 DIAGNOSIS — J069 Acute upper respiratory infection, unspecified: Secondary | ICD-10-CM | POA: Diagnosis not present

## 2023-01-22 DIAGNOSIS — H6692 Otitis media, unspecified, left ear: Secondary | ICD-10-CM | POA: Diagnosis not present

## 2023-01-22 DIAGNOSIS — J029 Acute pharyngitis, unspecified: Secondary | ICD-10-CM | POA: Diagnosis not present

## 2023-01-22 DIAGNOSIS — Z20828 Contact with and (suspected) exposure to other viral communicable diseases: Secondary | ICD-10-CM | POA: Diagnosis not present

## 2023-01-25 DIAGNOSIS — R062 Wheezing: Secondary | ICD-10-CM | POA: Diagnosis not present

## 2023-01-25 DIAGNOSIS — R059 Cough, unspecified: Secondary | ICD-10-CM | POA: Diagnosis not present

## 2023-01-25 DIAGNOSIS — R030 Elevated blood-pressure reading, without diagnosis of hypertension: Secondary | ICD-10-CM | POA: Diagnosis not present

## 2023-01-25 DIAGNOSIS — F17210 Nicotine dependence, cigarettes, uncomplicated: Secondary | ICD-10-CM | POA: Diagnosis not present

## 2023-01-25 DIAGNOSIS — Z6838 Body mass index (BMI) 38.0-38.9, adult: Secondary | ICD-10-CM | POA: Diagnosis not present

## 2023-03-14 DIAGNOSIS — F17210 Nicotine dependence, cigarettes, uncomplicated: Secondary | ICD-10-CM | POA: Diagnosis not present

## 2023-03-14 DIAGNOSIS — Z6838 Body mass index (BMI) 38.0-38.9, adult: Secondary | ICD-10-CM | POA: Diagnosis not present

## 2023-03-14 DIAGNOSIS — M25571 Pain in right ankle and joints of right foot: Secondary | ICD-10-CM | POA: Diagnosis not present

## 2023-03-14 DIAGNOSIS — R221 Localized swelling, mass and lump, neck: Secondary | ICD-10-CM | POA: Diagnosis not present

## 2023-03-14 DIAGNOSIS — R030 Elevated blood-pressure reading, without diagnosis of hypertension: Secondary | ICD-10-CM | POA: Diagnosis not present

## 2023-03-21 DIAGNOSIS — R221 Localized swelling, mass and lump, neck: Secondary | ICD-10-CM | POA: Diagnosis not present

## 2023-03-31 DIAGNOSIS — Z6838 Body mass index (BMI) 38.0-38.9, adult: Secondary | ICD-10-CM | POA: Diagnosis not present

## 2023-03-31 DIAGNOSIS — F17210 Nicotine dependence, cigarettes, uncomplicated: Secondary | ICD-10-CM | POA: Diagnosis not present

## 2023-03-31 DIAGNOSIS — E781 Pure hyperglyceridemia: Secondary | ICD-10-CM | POA: Diagnosis not present

## 2023-03-31 DIAGNOSIS — E669 Obesity, unspecified: Secondary | ICD-10-CM | POA: Diagnosis not present

## 2023-03-31 DIAGNOSIS — I10 Essential (primary) hypertension: Secondary | ICD-10-CM | POA: Diagnosis not present

## 2023-06-24 DIAGNOSIS — M25571 Pain in right ankle and joints of right foot: Secondary | ICD-10-CM | POA: Diagnosis not present

## 2023-07-25 DIAGNOSIS — K219 Gastro-esophageal reflux disease without esophagitis: Secondary | ICD-10-CM | POA: Diagnosis not present

## 2023-07-25 DIAGNOSIS — Z1329 Encounter for screening for other suspected endocrine disorder: Secondary | ICD-10-CM | POA: Diagnosis not present

## 2023-07-25 DIAGNOSIS — Z13220 Encounter for screening for lipoid disorders: Secondary | ICD-10-CM | POA: Diagnosis not present

## 2023-07-25 DIAGNOSIS — I10 Essential (primary) hypertension: Secondary | ICD-10-CM | POA: Diagnosis not present

## 2023-07-29 DIAGNOSIS — Z6838 Body mass index (BMI) 38.0-38.9, adult: Secondary | ICD-10-CM | POA: Diagnosis not present

## 2023-07-29 DIAGNOSIS — E781 Pure hyperglyceridemia: Secondary | ICD-10-CM | POA: Diagnosis not present

## 2023-07-29 DIAGNOSIS — Z1331 Encounter for screening for depression: Secondary | ICD-10-CM | POA: Diagnosis not present

## 2023-07-29 DIAGNOSIS — Z0000 Encounter for general adult medical examination without abnormal findings: Secondary | ICD-10-CM | POA: Diagnosis not present

## 2023-07-29 DIAGNOSIS — E669 Obesity, unspecified: Secondary | ICD-10-CM | POA: Diagnosis not present

## 2023-07-29 DIAGNOSIS — Z0001 Encounter for general adult medical examination with abnormal findings: Secondary | ICD-10-CM | POA: Diagnosis not present

## 2023-07-29 DIAGNOSIS — K219 Gastro-esophageal reflux disease without esophagitis: Secondary | ICD-10-CM | POA: Diagnosis not present

## 2023-07-29 DIAGNOSIS — Z1389 Encounter for screening for other disorder: Secondary | ICD-10-CM | POA: Diagnosis not present

## 2023-07-29 DIAGNOSIS — I10 Essential (primary) hypertension: Secondary | ICD-10-CM | POA: Diagnosis not present

## 2023-09-09 DIAGNOSIS — Z20828 Contact with and (suspected) exposure to other viral communicable diseases: Secondary | ICD-10-CM | POA: Diagnosis not present

## 2023-09-09 DIAGNOSIS — J101 Influenza due to other identified influenza virus with other respiratory manifestations: Secondary | ICD-10-CM | POA: Diagnosis not present

## 2023-09-09 DIAGNOSIS — Z6838 Body mass index (BMI) 38.0-38.9, adult: Secondary | ICD-10-CM | POA: Diagnosis not present

## 2024-02-07 DIAGNOSIS — E669 Obesity, unspecified: Secondary | ICD-10-CM | POA: Diagnosis not present

## 2024-02-07 DIAGNOSIS — E781 Pure hyperglyceridemia: Secondary | ICD-10-CM | POA: Diagnosis not present

## 2024-02-07 DIAGNOSIS — Z0001 Encounter for general adult medical examination with abnormal findings: Secondary | ICD-10-CM | POA: Diagnosis not present

## 2024-02-07 DIAGNOSIS — Z6838 Body mass index (BMI) 38.0-38.9, adult: Secondary | ICD-10-CM | POA: Diagnosis not present

## 2024-02-07 DIAGNOSIS — Z1329 Encounter for screening for other suspected endocrine disorder: Secondary | ICD-10-CM | POA: Diagnosis not present

## 2024-02-07 DIAGNOSIS — I10 Essential (primary) hypertension: Secondary | ICD-10-CM | POA: Diagnosis not present

## 2024-02-07 DIAGNOSIS — K219 Gastro-esophageal reflux disease without esophagitis: Secondary | ICD-10-CM | POA: Diagnosis not present

## 2024-02-07 DIAGNOSIS — Z13220 Encounter for screening for lipoid disorders: Secondary | ICD-10-CM | POA: Diagnosis not present

## 2024-03-21 DIAGNOSIS — Z20828 Contact with and (suspected) exposure to other viral communicable diseases: Secondary | ICD-10-CM | POA: Diagnosis not present

## 2024-03-21 DIAGNOSIS — Z6837 Body mass index (BMI) 37.0-37.9, adult: Secondary | ICD-10-CM | POA: Diagnosis not present

## 2024-03-21 DIAGNOSIS — J029 Acute pharyngitis, unspecified: Secondary | ICD-10-CM | POA: Diagnosis not present

## 2024-03-21 DIAGNOSIS — Z2089 Contact with and (suspected) exposure to other communicable diseases: Secondary | ICD-10-CM | POA: Diagnosis not present

## 2024-03-21 DIAGNOSIS — Z112 Encounter for screening for other bacterial diseases: Secondary | ICD-10-CM | POA: Diagnosis not present

## 2024-03-21 DIAGNOSIS — J069 Acute upper respiratory infection, unspecified: Secondary | ICD-10-CM | POA: Diagnosis not present

## 2024-05-28 ENCOUNTER — Other Ambulatory Visit (HOSPITAL_COMMUNITY): Payer: Self-pay | Admitting: Physician Assistant

## 2024-05-28 DIAGNOSIS — R221 Localized swelling, mass and lump, neck: Secondary | ICD-10-CM

## 2024-05-30 ENCOUNTER — Other Ambulatory Visit (HOSPITAL_COMMUNITY): Payer: Self-pay | Admitting: Physician Assistant

## 2024-05-30 DIAGNOSIS — R221 Localized swelling, mass and lump, neck: Secondary | ICD-10-CM

## 2024-06-21 ENCOUNTER — Ambulatory Visit (HOSPITAL_COMMUNITY)

## 2024-07-05 ENCOUNTER — Ambulatory Visit (HOSPITAL_COMMUNITY)
